# Patient Record
Sex: Male | Born: 1960 | Race: Black or African American | Hispanic: No | Marital: Married | Smoking: Current every day smoker
Health system: Southern US, Community
[De-identification: ages and names within clinical notes are randomized; demographics above are authoritative.]

## PROBLEM LIST (undated history)

## (undated) ENCOUNTER — Emergency Department

## (undated) DIAGNOSIS — E7800 Pure hypercholesterolemia, unspecified: Secondary | ICD-10-CM

## (undated) DIAGNOSIS — I10 Essential (primary) hypertension: Secondary | ICD-10-CM

## (undated) DIAGNOSIS — M109 Gout, unspecified: Secondary | ICD-10-CM

## (undated) HISTORY — PX: PROSTATECTOMY: SHX69

## (undated) HISTORY — PX: KNEE SURGERY: SHX244

---

## 2006-04-04 ENCOUNTER — Emergency Department (HOSPITAL_COMMUNITY): Admission: EM | Admit: 2006-04-04 | Discharge: 2006-04-04 | Payer: Self-pay | Admitting: Emergency Medicine

## 2006-10-17 ENCOUNTER — Emergency Department (HOSPITAL_COMMUNITY): Admission: EM | Admit: 2006-10-17 | Discharge: 2006-10-17 | Payer: Self-pay | Admitting: Emergency Medicine

## 2009-06-05 ENCOUNTER — Emergency Department (HOSPITAL_BASED_OUTPATIENT_CLINIC_OR_DEPARTMENT_OTHER): Admission: EM | Admit: 2009-06-05 | Discharge: 2009-06-05 | Payer: Self-pay | Admitting: Emergency Medicine

## 2009-06-05 ENCOUNTER — Ambulatory Visit: Payer: Self-pay | Admitting: Diagnostic Radiology

## 2010-11-06 ENCOUNTER — Emergency Department (INDEPENDENT_AMBULATORY_CARE_PROVIDER_SITE_OTHER)

## 2010-11-06 ENCOUNTER — Emergency Department (HOSPITAL_BASED_OUTPATIENT_CLINIC_OR_DEPARTMENT_OTHER)
Admission: EM | Admit: 2010-11-06 | Discharge: 2010-11-06 | Disposition: A | Discharge status: Home / Self Care | Attending: Emergency Medicine | Admitting: Emergency Medicine

## 2010-11-06 DIAGNOSIS — S2249XA Multiple fractures of ribs, unspecified side, initial encounter for closed fracture: Secondary | ICD-10-CM

## 2010-11-06 DIAGNOSIS — I10 Essential (primary) hypertension: Secondary | ICD-10-CM | POA: Insufficient documentation

## 2010-11-06 DIAGNOSIS — F17200 Nicotine dependence, unspecified, uncomplicated: Secondary | ICD-10-CM | POA: Insufficient documentation

## 2010-11-06 DIAGNOSIS — W010XXA Fall on same level from slipping, tripping and stumbling without subsequent striking against object, initial encounter: Secondary | ICD-10-CM

## 2010-11-06 DIAGNOSIS — E7800 Pure hypercholesterolemia, unspecified: Secondary | ICD-10-CM | POA: Insufficient documentation

## 2010-11-06 DIAGNOSIS — Y92009 Unspecified place in unspecified non-institutional (private) residence as the place of occurrence of the external cause: Secondary | ICD-10-CM | POA: Insufficient documentation

## 2010-11-14 LAB — CBC
HCT: 45.7 % (ref 39.0–52.0)
Hemoglobin: 15.7 g/dL (ref 13.0–17.0)
Platelets: 286 10*3/uL (ref 150–400)

## 2010-11-14 LAB — BASIC METABOLIC PANEL
Calcium: 9.3 mg/dL (ref 8.4–10.5)
Chloride: 103 mEq/L (ref 96–112)
GFR calc Af Amer: >60 mL/min (ref >60)
Glucose, Bld: 102 mg/dL — ABNORMAL HIGH (ref 70–99)

## 2010-11-14 LAB — DIFFERENTIAL
Basophils Relative: 2 % — ABNORMAL HIGH (ref 0–1)
Lymphs Abs: 2.3 10*3/uL (ref 0.7–4.0)
Monocytes Absolute: 0.9 10*3/uL (ref 0.1–1.0)
Monocytes Relative: 8 % (ref 3–12)
Neutro Abs: 7 10*3/uL (ref 1.7–7.7)

## 2010-11-14 LAB — URINALYSIS, ROUTINE W REFLEX MICROSCOPIC
Bilirubin Urine: NEGATIVE
Glucose, UA: NEGATIVE mg/dL
Ketones, ur: NEGATIVE mg/dL
Specific Gravity, Urine: 1.023 (ref 1.005–1.030)
Urobilinogen, UA: 0.2 mg/dL (ref 0.0–1.0)
pH: 6 (ref 5.0–8.0)

## 2010-11-14 LAB — URINE MICROSCOPIC-ADD ON

## 2015-03-15 ENCOUNTER — Emergency Department (HOSPITAL_BASED_OUTPATIENT_CLINIC_OR_DEPARTMENT_OTHER)

## 2015-03-15 ENCOUNTER — Emergency Department (HOSPITAL_BASED_OUTPATIENT_CLINIC_OR_DEPARTMENT_OTHER)
Admission: EM | Admit: 2015-03-15 | Discharge: 2015-03-15 | Disposition: A | Discharge status: Home / Self Care | Attending: Emergency Medicine | Admitting: Emergency Medicine

## 2015-03-15 ENCOUNTER — Encounter (HOSPITAL_BASED_OUTPATIENT_CLINIC_OR_DEPARTMENT_OTHER): Payer: Self-pay | Admitting: *Deleted

## 2015-03-15 DIAGNOSIS — Y9289 Other specified places as the place of occurrence of the external cause: Secondary | ICD-10-CM | POA: Insufficient documentation

## 2015-03-15 DIAGNOSIS — Y9389 Activity, other specified: Secondary | ICD-10-CM | POA: Insufficient documentation

## 2015-03-15 DIAGNOSIS — Z79899 Other long term (current) drug therapy: Secondary | ICD-10-CM | POA: Insufficient documentation

## 2015-03-15 DIAGNOSIS — W312XXA Contact with powered woodworking and forming machines, initial encounter: Secondary | ICD-10-CM | POA: Diagnosis not present

## 2015-03-15 DIAGNOSIS — Y998 Other external cause status: Secondary | ICD-10-CM | POA: Diagnosis not present

## 2015-03-15 DIAGNOSIS — S62521B Displaced fracture of distal phalanx of right thumb, initial encounter for open fracture: Secondary | ICD-10-CM | POA: Insufficient documentation

## 2015-03-15 DIAGNOSIS — E780 Pure hypercholesterolemia: Secondary | ICD-10-CM | POA: Insufficient documentation

## 2015-03-15 DIAGNOSIS — I10 Essential (primary) hypertension: Secondary | ICD-10-CM | POA: Insufficient documentation

## 2015-03-15 DIAGNOSIS — S61011A Laceration without foreign body of right thumb without damage to nail, initial encounter: Secondary | ICD-10-CM | POA: Diagnosis present

## 2015-03-15 HISTORY — DX: Essential (primary) hypertension: I10

## 2015-03-15 HISTORY — DX: Pure hypercholesterolemia, unspecified: E78.00

## 2015-03-15 MED ORDER — CEFTRIAXONE SODIUM 1 G IJ SOLR
1.0000 g | Freq: Once | INTRAMUSCULAR | Status: AC
  Administered 2015-03-15: 1 g via INTRAMUSCULAR
  Filled 2015-03-15: qty 10

## 2015-03-15 MED ORDER — TETANUS-DIPHTH-ACELL PERTUSSIS 5-2.5-18.5 LF-MCG/0.5 IM SUSP
0.5000 mL | Freq: Once | INTRAMUSCULAR | Status: AC
  Administered 2015-03-15: 0.5 mL via INTRAMUSCULAR
  Filled 2015-03-15: qty 0.5

## 2015-03-15 MED ORDER — OXYCODONE-ACETAMINOPHEN 5-325 MG PO TABS: 1.0000 | ORAL_TABLET | Freq: Four times a day (QID) | ORAL | Status: DC | PRN

## 2015-03-15 MED ORDER — CEPHALEXIN 500 MG PO CAPS: 500.0000 mg | ORAL_CAPSULE | Freq: Four times a day (QID) | ORAL | Status: DC

## 2015-03-15 MED ORDER — LIDOCAINE HCL (PF) 1 % IJ SOLN
INTRAMUSCULAR | Status: AC
  Administered 2015-03-15: 5 mL
  Filled 2015-03-15: qty 5

## 2015-03-15 MED ORDER — LIDOCAINE HCL (PF) 1 % IJ SOLN: 30.0000 mL | Freq: Once | INTRAMUSCULAR | Status: DC

## 2015-03-15 MED ORDER — OXYCODONE-ACETAMINOPHEN 5-325 MG PO TABS
2.0000 | ORAL_TABLET | Freq: Once | ORAL | Status: AC
  Administered 2015-03-15: 2 via ORAL
  Filled 2015-03-15: qty 2

## 2015-03-15 MED ORDER — LIDOCAINE HCL 2 % IJ SOLN
INTRAMUSCULAR | Status: AC
  Administered 2015-03-15: 400 mg
  Filled 2015-03-15: qty 20

## 2015-03-15 NOTE — Discharge Instructions (Signed)
Keflex as prescribed.  Percocet as prescribed as needed for pain.  Follow-up with Dr. Izora Ribas on Monday for a wound check. Call his office tomorrow to arrange this appointment. The contact information for his office has been provided in this discharge summary.   Thumb Fracture  There are many types of thumb fractures (breaks). There are different ways of treating these fractures, all of which may be correct, varying from case to case. Your caregiver will discuss different ways to treat these fractures with you. TREATMENT   Immobilization. This means the fracture is casted as it is without changing the positions of the fracture (bone pieces) involved. This fracture is casted in a "thumb spica" also called a hitchhiker cast. It is generally left on for 2 to 6 weeks.  Closed reduction. The bones are manipulated back into position without using surgery.  ORIF (open reduction and internal fixation). The fracture site is opened and the bone pieces are fixed into place with some type of hardware such as screws or wires. Your caregiver will discuss the type of fracture you have and the treatment that will be best for that problem. If surgery is the treatment of choice, the following is information for you to know and to let your caregiver know about prior to surgery. LET YOUR CAREGIVERS KNOW ABOUT:  Allergies.  Medications taken including herbs, eye drops, over the counter medications, and creams.  Use of steroids (by mouth or creams).  Previous problems with anesthetics or Novocain.  Family history of anesthetic complications..  Possibility of pregnancy, if this applies.  History of blood clots (thrombophlebitis).  History of bleeding or blood problems.  Previous surgery.  Other health problems. AFTER THE PROCEDURE  After surgery, you will be taken to the recovery area. A nurse will watch and check your progress. Once you are awake, stable, and taking fluids well, barring other problems  you will be allowed to go home. Once home, an ice pack applied to your operative site may help with discomfort and keep the swelling down. Elevate your hand above your heart as much as possible for the first 4-5 days after the injury/surgery. HOME CARE INSTRUCTIONS   Follow your caregiver's instructions as to activities, exercises, physical therapy, and driving a car.  Use thumb and exercise as directed.  Only take over-the-counter or prescription medicines for pain, discomfort, or fever as directed by your caregiver. Do not take aspirin until your caregiver instructs. This can increase bleeding immediately following surgery. SEEK MEDICAL CARE IF:   There is increased bleeding (more than a small spot) from the wound or from beneath your cast or splint.  There is redness, swelling, or increasing pain in the wound or from beneath your cast or splint.  You have pus coming from wound or from beneath your cast or splint.  An unexplained oral temperature above 102 F (38.9 C) develops.  There is a foul smell coming from the wound or dressing or from beneath your cast or splint. SEEK IMMEDIATE MEDICAL CARE IF:   You develop severe pain, decreased sensation such as numbness or tingling.  You develop a rash.  You have difficulty breathing.  Youhave any allergic problems. If you do not have a window in your cast for observing the wound, a discharge or minor bleeding may show up as a stain on the outside of your cast. Report these findings to your caregiver. If you have a removable splint overlying the surgical dressings it is common to see a small  amount of bleeding. Change the dressings as instructed by your caregiver. Document Released: 04/26/2003 Document Revised: 10/20/2011 Document Reviewed: 09/30/2013 [facility] Patient Information 2015 [place], [place]. This information is not intended to replace advice given to you by your health care provider. Make sure you discuss any questions you have  with your health care provider.

## 2015-03-15 NOTE — ED Provider Notes (Signed)
CSN: [id number]     Arrival date & time 03/15/15  2039 History  This chart was scribed for Geoffery Lyons, MD by Octavia Heir, ED Scribe. This patient was seen in room MH07/MH07 and the patient's care was started at 9:26 PM.    Chief Complaint  Patient presents with  . Extremity Laceration      Patient is a 54 y.o. male presenting with skin laceration. The history is provided by the patient. No language interpreter was used.  Laceration Location:  Hand Hand laceration location:  L finger Quality: jagged   Bleeding: controlled   Laceration mechanism:  Unable to specify Pain details:    Quality:  Radiating and throbbing   Severity:  Moderate   Timing:  Constant   Progression:  Unchanged Foreign body present:  No foreign bodies Relieved by:  Pressure Worsened by:  Nothing tried Ineffective treatments:  None tried Tetanus status:  Unknown  HPI Comments: Avenir Lozinski is a 54 y.o. male who presents to the Emergency Department presenting with a right thumb laceration onset this evening. Pt was cutting wood on a table saw and sliced the tip of his right thumb. He states he is an overall healthy person and he is unknown of his last tetanus shot.   Past Medical History  Diagnosis Date  . Hypertension   . Hypercholesteremia    History reviewed. No pertinent past surgical history. History reviewed. No pertinent family history. History  Substance Use Topics  . Smoking status: Never Smoker   . Smokeless tobacco: Not on file  . Alcohol Use: 2.4 oz/week    4 Shots of liquor per week    Review of Systems  Skin: Positive for wound.  All other systems reviewed and are negative.     Allergies  Review of patient's allergies indicates no known allergies.  Home Medications   Prior to Admission medications   Medication Sig Start Date End Date Taking? Authorizing Provider  atorvastatin (LIPITOR) 40 MG tablet Take 40 mg by mouth daily.   Yes Historical Provider, MD  febuxostat  (ULORIC) 40 MG tablet Take 40 mg by mouth daily.   Yes Historical Provider, MD  hydrochlorothiazide (HYDRODIURIL) 25 MG tablet Take 25 mg by mouth daily.   Yes Historical Provider, MD  potassium chloride (K-DUR,KLOR-CON) 10 MEQ tablet Take 10 mEq by mouth 2 (two) times daily.   Yes Historical Provider, MD  UNKNOWN TO PATIENT    Yes Historical Provider, MD   Triage vitals: BP 158/91 mmHg  Pulse 92  Temp(Src) 98.1 F (36.7 C) (Oral)  Resp 18  Ht 5' 11.5" (1.816 m)  Wt 220 lb (99.791 kg)  BMI 30.26 kg/m2  SpO2 98% Physical Exam  Constitutional: He is oriented to person, place, and time. He appears well-developed and well-nourished.  HENT:  Head: Normocephalic.  Eyes: EOM are normal.  Neck: Normal range of motion.  Pulmonary/Chest: Effort normal.  Abdominal: He exhibits no distension.  Musculoskeletal: Normal range of motion.  The right thumb has a jagged laceration through the tip of the finger.  Neurological: He is alert and oriented to person, place, and time.  Psychiatric: He has a normal mood and affect.  Nursing note and vitals reviewed.   ED Course  Procedures  DIAGNOSTIC STUDIES: Oxygen Saturation is 98% on RA, normal by my interpretation.  COORDINATION OF CARE:  9:29 PM Discussed treatment plan which includes talk with hand specialist and digital block with pt at bedside and pt agreed to plan.  Labs Review Labs Reviewed - No data to display  Imaging Review Dg Finger Thumb Right  03/15/2015   CLINICAL DATA:  Table saw injury  EXAM: RIGHT THUMB 2+V  COMPARISON:  None.  FINDINGS: There is a bony injury to the distal tuft with multiple tiny fragments displaced away from the palmar aspect but there does not appear to be a fracture across the full cross-section of the bone. There is no dislocation. There is disruption of the overlying soft tissues. There is no metallic foreign body.  IMPRESSION: Bony injury with multiple small fragments displaced away from the palmar aspect of  the distal tuft. No foreign body evident.   Electronically Signed   By: Ellery Plunk M.D.   On: 03/15/2015 21:22     EKG Interpretation None     LACERATION REPAIR Performed by: Nance Pear PA Student Authorized by: Geoffery Lyons Consent: Verbal consent obtained. Risks and benefits: risks, benefits and alternatives were discussed Consent given by: patient Patient identity confirmed: provided demographic data Prepped and Draped in normal sterile fashion Wound explored  Laceration Location: Right thumb  Laceration Length: 1.5 cm  No Foreign Bodies seen or palpated  Anesthesia: Digital block   Local anesthetic: lidocaine 2 % without epinephrine  Anesthetic total: 6 ml  Irrigation method: syringe Amount of cleaning: standard  Skin closure: 5-0 Prolene   Number of sutures: 4   Technique: Simple interrupted   Patient tolerance: Patient tolerated the procedure well with no immediate complications.   MDM   Final diagnoses:  None    Patient presents with a laceration to his right thumb that occurred while using a table saw. The x-rays reveal small fragments of the tuft consistent with an open fracture. This was treated with copious irrigation, antibiotics, wound closure, and follow-up with hand surgery. I've discussed this case with Dr. Izora Ribas who is in agreement with my treatment plan. He will follow-up in the office next week for a wound check.  The sutures were placed by Physician Assistant Student, Daphane Shepherd under my direct supervision.  I personally performed the services described in this documentation, which was scribed in my presence. The recorded information has been reviewed and is accurate.     Geoffery Lyons, MD 03/15/15 2256

## 2015-03-15 NOTE — ED Notes (Signed)
Pt cut his right thumb on his table saw tonight.  Bleeding controlled in triage.

## 2015-03-15 NOTE — ED Notes (Signed)
Lac to rt thumb w table saw,, power had bee out off at time of lac

## 2017-12-24 ENCOUNTER — Other Ambulatory Visit: Payer: Self-pay

## 2017-12-24 ENCOUNTER — Emergency Department (HOSPITAL_BASED_OUTPATIENT_CLINIC_OR_DEPARTMENT_OTHER)
Admission: EM | Admit: 2017-12-24 | Discharge: 2017-12-24 | Disposition: A | Discharge status: Home / Self Care | Attending: Emergency Medicine | Admitting: Emergency Medicine

## 2017-12-24 ENCOUNTER — Encounter (HOSPITAL_BASED_OUTPATIENT_CLINIC_OR_DEPARTMENT_OTHER): Payer: Self-pay | Admitting: Emergency Medicine

## 2017-12-24 DIAGNOSIS — I10 Essential (primary) hypertension: Secondary | ICD-10-CM | POA: Insufficient documentation

## 2017-12-24 DIAGNOSIS — M109 Gout, unspecified: Secondary | ICD-10-CM | POA: Insufficient documentation

## 2017-12-24 DIAGNOSIS — M79674 Pain in right toe(s): Secondary | ICD-10-CM | POA: Diagnosis present

## 2017-12-24 DIAGNOSIS — Z79899 Other long term (current) drug therapy: Secondary | ICD-10-CM | POA: Diagnosis not present

## 2017-12-24 DIAGNOSIS — F17290 Nicotine dependence, other tobacco product, uncomplicated: Secondary | ICD-10-CM | POA: Diagnosis not present

## 2017-12-24 HISTORY — DX: Gout, unspecified: M10.9

## 2017-12-24 MED ORDER — OXYCODONE-ACETAMINOPHEN 5-325 MG PO TABS: 1.0000 | ORAL_TABLET | ORAL | 0 refills | Status: DC | PRN

## 2017-12-24 MED ORDER — INDOMETHACIN 50 MG PO CAPS: 50.0000 mg | ORAL_CAPSULE | Freq: Three times a day (TID) | ORAL | 0 refills | Status: DC | PRN

## 2017-12-24 NOTE — ED Triage Notes (Signed)
Pt c/o gout flare to R big toe x 1 week. States the [place] has ordered his indomethacin but they were out of it and it has to be mailed to him so he will not receive it for several more days.

## 2017-12-24 NOTE — ED Provider Notes (Signed)
[facility] EMERGENCY DEPARTMENT Provider Note   CSN: [id number] Arrival date & time: 12/24/17  4098     History   Chief Complaint Chief Complaint  Patient presents with  . Gout    HPI Albert King is a 57 y.o. male.  The history is provided by the patient and medical records.  Toe Pain  This is a recurrent problem. The current episode started 2 days ago. The problem occurs constantly. The problem has not changed since onset.Pertinent negatives include no chest pain, no abdominal pain, no headaches and no shortness of breath. The symptoms are aggravated by standing and walking. Nothing relieves the symptoms. He has tried nothing for the symptoms. The treatment provided no relief.    Past Medical History:  Diagnosis Date  . Gout   . Hypercholesteremia   . Hypertension     There are no active problems to display for this patient.   Past Surgical History:  Procedure Laterality Date  . KNEE SURGERY          Home Medications    Prior to Admission medications   Medication Sig Start Date End Date Taking? Authorizing Provider  losartan (COZAAR) 100 MG tablet Take 100 mg by mouth daily.   Yes [provider]  meloxicam (MOBIC) 15 MG tablet Take 15 mg by mouth daily.   Yes [provider]  tamsulosin (FLOMAX) 0.4 MG CAPS capsule Take 0.4 mg by mouth.   Yes [provider]  atorvastatin (LIPITOR) 40 MG tablet Take 40 mg by mouth daily.    [provider]  cephALEXin (KEFLEX) 500 MG capsule Take 1 capsule (500 mg total) by mouth 4 (four) times daily. 03/15/15   Geoffery Lyons, MD  febuxostat (ULORIC) 40 MG tablet Take 40 mg by mouth daily.    [provider]  hydrochlorothiazide (HYDRODIURIL) 25 MG tablet Take 25 mg by mouth daily.    [provider]  oxyCODONE-acetaminophen (PERCOCET) 5-325 MG per tablet Take 1-2 tablets by mouth every 6 (six) hours as needed. 03/15/15   Geoffery Lyons, MD  potassium chloride  (K-DUR,KLOR-CON) 10 MEQ tablet Take 10 mEq by mouth 2 (two) times daily.    [provider]  UNKNOWN TO PATIENT     [provider]    Family History No family history on file.  Social History Social History   Tobacco Use  . Smoking status: Current Every Day Smoker    Types: E-cigarettes  . Smokeless tobacco: Never Used  Substance Use Topics  . Alcohol use: Yes    Alcohol/week: 2.4 oz    Types: 4 Shots of liquor per week  . Drug use: No     Allergies   Allopurinol   Review of Systems Review of Systems  Constitutional: Negative for chills, fatigue and fever.  HENT: Negative for congestion.   Eyes: Negative for visual disturbance.  Respiratory: Negative for cough, chest tightness, shortness of breath and wheezing.   Cardiovascular: Negative for chest pain and palpitations.  Gastrointestinal: Negative for abdominal pain.  Genitourinary: Negative for flank pain.  Musculoskeletal: Negative for back pain, neck pain and neck stiffness.  Skin: Positive for color change (redness near toe). Negative for wound.  Neurological: Negative for light-headedness and headaches.  Psychiatric/Behavioral: Negative for agitation.  All other systems reviewed and are negative.    Physical Exam Updated Vital Signs BP (!) 151/79 (BP Location: Right Arm)   Pulse 82   Temp 98.3 F (36.8 C) (Oral)   Resp  18   SpO2 98%   Physical Exam  Constitutional: He is oriented to person, place, and time. He appears well-developed and well-nourished. No distress.  HENT:  Head: Normocephalic and atraumatic.  Mouth/Throat: Oropharynx is clear and moist.  Eyes: Conjunctivae are normal.  Neck: Neck supple.  Cardiovascular: Normal rate.  Pulmonary/Chest: Effort normal. No respiratory distress.  Abdominal: Soft. There is no tenderness.  Musculoskeletal: He exhibits tenderness. He exhibits no edema.       Right foot: There is tenderness. There is no swelling, normal capillary refill,  no crepitus, no deformity and no laceration.       Feet:  Neurological: He is alert and oriented to person, place, and time. No sensory deficit. He exhibits normal muscle tone.  Skin: Skin is warm and dry. Capillary refill takes less than 2 seconds. He is not diaphoretic. There is erythema. No pallor.  Psychiatric: He has a normal mood and affect.  Nursing note and vitals reviewed.    ED Treatments / Results  Labs (all labs ordered are listed, but only abnormal results are displayed) Labs Reviewed - No data to display  EKG None  Radiology No results found.  Procedures Procedures (including critical care time)  Medications Ordered in ED Medications - No data to display   Initial Impression / Assessment and Plan / ED Course  I have reviewed the triage vital signs and the nursing notes.  Pertinent labs & imaging results that were available during my care of the patient were reviewed by me and considered in my medical decision making (see chart for details).     Albert King is a 57 y.o. male with past medical history significant for hypertension, hypercholesterolemia, and gout who presents with gout flare of her right great toe.  Patient reports that he started having pain several days ago in his right great toe and saw [facility] several days ago.  He says that he had blood work and imaging that was reassuring and consistent with a gout flare.  He reports that he was given a prescription for indomethacin however his by mail pharmacy has been unable to send in the medications yet.  He says that his pain is continued and is moderate.  He reports that he is unable to stand while at work due to the pain.  He says that he has had no success with over-the-counter medications.  He denies fevers, chills, or any other symptoms.  He reports is consistent with prior gout flares.  On exam, patient has warmth redness and tenderness of the right base of the great toe.  Normal ankle  movement.  Normal sensation and pulse.  Normal toe strength.  Exam otherwise unremarkable.  No other redness seen and no evidence of laceration.  Given consistent with prior gout flare, suspect gout.  Patient's primary physician team thought that this was his gout.  Patient just has not been able to get the medication as needed for management.  Patient will be given prescription for indomethacin as well as several doses of Percocet to help with the acute pain.  Patient will follow-up with his [facility] and PCP for further management.  Do not feel patient has cellulitis or other deep infection of the foot.  Patient otherwise appears well.  Patient understood plan of care as well as return precautions.  Patient discharged in good condition.    Final Clinical Impressions(s) / ED Diagnoses   Final diagnoses:  Gouty arthritis of right great toe  ED Discharge Orders        Ordered    indomethacin (INDOCIN) 50 MG capsule  3 times daily PRN     12/24/17 0750    oxyCODONE-acetaminophen (PERCOCET/ROXICET) 5-325 MG tablet  Every 4 hours PRN     12/24/17 0750      Clinical Impression: 1. Gouty arthritis of right great toe     Disposition: Discharge  Condition: Good  I have discussed the results, Dx and Tx plan with the pt(& family if present). He/she/they expressed understanding and agree(s) with the plan. Discharge instructions discussed at great length. Strict return precautions discussed and pt &/or family have verbalized understanding of the instructions. No further questions at time of discharge.    New Prescriptions   INDOMETHACIN (INDOCIN) 50 MG CAPSULE    Take 1 capsule (50 mg total) by mouth 3 (three) times daily as needed for moderate pain.   OXYCODONE-ACETAMINOPHEN (PERCOCET/ROXICET) 5-325 MG TABLET    Take 1 tablet by mouth every 4 (four) hours as needed for severe pain.    Follow Up: [facility] [address] [id number] mc [address] [place] [address] [phone]       Tegeler, Canary Brim, MD 12/24/17 [phone]

## 2017-12-24 NOTE — ED Notes (Signed)
ED Provider at bedside. 

## 2017-12-24 NOTE — Discharge Instructions (Signed)
Please follow-up with your primary care physician and your [facility] team for further management of your gout.  Please use the medicine they have sent you for further management.  Please use the medicine we are writing today to help with your acute pain.  If any symptoms change or worsen, please return to the nearest emergency department.

## 2020-07-21 ENCOUNTER — Emergency Department (HOSPITAL_BASED_OUTPATIENT_CLINIC_OR_DEPARTMENT_OTHER)

## 2020-07-21 ENCOUNTER — Encounter (HOSPITAL_BASED_OUTPATIENT_CLINIC_OR_DEPARTMENT_OTHER): Payer: Self-pay | Admitting: Emergency Medicine

## 2020-07-21 ENCOUNTER — Emergency Department (HOSPITAL_BASED_OUTPATIENT_CLINIC_OR_DEPARTMENT_OTHER)
Admission: EM | Admit: 2020-07-21 | Discharge: 2020-07-21 | Disposition: A | Discharge status: Home / Self Care | Attending: Emergency Medicine | Admitting: Emergency Medicine

## 2020-07-21 ENCOUNTER — Other Ambulatory Visit: Payer: Self-pay

## 2020-07-21 DIAGNOSIS — F17290 Nicotine dependence, other tobacco product, uncomplicated: Secondary | ICD-10-CM | POA: Diagnosis not present

## 2020-07-21 DIAGNOSIS — M25562 Pain in left knee: Secondary | ICD-10-CM | POA: Diagnosis not present

## 2020-07-21 DIAGNOSIS — I10 Essential (primary) hypertension: Secondary | ICD-10-CM | POA: Diagnosis not present

## 2020-07-21 DIAGNOSIS — Z79899 Other long term (current) drug therapy: Secondary | ICD-10-CM | POA: Insufficient documentation

## 2020-07-21 MED ORDER — PREDNISONE 50 MG PO TABS: 50.0000 mg | ORAL_TABLET | Freq: Every day | ORAL | 0 refills | Status: DC

## 2020-07-21 NOTE — ED Triage Notes (Signed)
Pt arrives pov, ambulatory with c/o left knee pain x 2 days. Pt endorses hx of same, reports surgery 2 yrs pta. Pt states taking meloxicam this am

## 2020-07-21 NOTE — ED Notes (Signed)
Pt discharged to home. Discharge instructions have been discussed with patient and/or family members. Pt verbally acknowledges understanding d/c instructions, and endorses comprehension to checkout at registration before leaving.  °

## 2020-07-21 NOTE — ED Provider Notes (Signed)
[facility] EMERGENCY DEPARTMENT Provider Note   CSN: [id number] Arrival date & time: 07/21/20  3532     History Chief Complaint  Patient presents with  . Knee Pain    Albert King is a 59 y.o. male.  HPI   Patient presents to the ED with complaints of left-sided knee pain.  Patient states he started noticing pain a couple of days ago.  Last evening and this morning the pain was more severe.  He did try taking his meloxicam but the pain persisted.  Patient does have history of prior issues with his left knee.  He had surgery a couple years ago and also has had steroid injections.  He denies any fevers.  No recent falls.  Past Medical History:  Diagnosis Date  . Gout   . Hypercholesteremia   . Hypertension     There are no problems to display for this patient.   Past Surgical History:  Procedure Laterality Date  . KNEE SURGERY         History reviewed. No pertinent family history.  Social History   Tobacco Use  . Smoking status: Current Every Day Smoker    Types: E-cigarettes  . Smokeless tobacco: Never Used  Vaping Use  . Vaping Use: Every day  Substance Use Topics  . Alcohol use: Yes    Alcohol/week: 4.0 standard drinks    Types: 4 Shots of liquor per week  . Drug use: No    Home Medications Prior to Admission medications   Medication Sig Start Date End Date Taking? Authorizing Provider  amLODipine (NORVASC) 5 MG tablet Take by mouth. 01/19/19  Yes [provider]  colchicine 0.6 MG tablet Take 2 tablets by mouth once and take 1 tablet by mouth 1 hour later as needed for acute gout flare only 04/22/19  Yes [provider]  DULoxetine (CYMBALTA) 20 MG capsule Take by mouth. 01/11/19  Yes [provider]  sildenafil (VIAGRA) 100 MG tablet Take by mouth. 01/11/19  Yes [provider]  atorvastatin (LIPITOR) 40 MG tablet Take 40 mg by mouth daily.    [provider]  cephALEXin (KEFLEX) 500 MG capsule Take  1 capsule (500 mg total) by mouth 4 (four) times daily. 03/15/15   Geoffery Lyons, MD  diclofenac Sodium (VOLTAREN) 1 % GEL Place onto the skin.    [provider]  febuxostat (ULORIC) 40 MG tablet Take 40 mg by mouth daily.    [provider]  hydrochlorothiazide (HYDRODIURIL) 25 MG tablet Take 25 mg by mouth daily.    [provider]  indomethacin (INDOCIN) 50 MG capsule Take 1 capsule (50 mg total) by mouth 3 (three) times daily as needed for moderate pain. 12/24/17   Tegeler, Canary Brim, MD  losartan (COZAAR) 100 MG tablet Take 100 mg by mouth daily.    [provider]  meloxicam (MOBIC) 15 MG tablet Take 15 mg by mouth daily.    [provider]  oxyCODONE-acetaminophen (PERCOCET) 5-325 MG per tablet Take 1-2 tablets by mouth every 6 (six) hours as needed. 03/15/15   Geoffery Lyons, MD  oxyCODONE-acetaminophen (PERCOCET/ROXICET) 5-325 MG tablet Take 1 tablet by mouth every 4 (four) hours as needed for severe pain. 12/24/17   Tegeler, Canary Brim, MD  potassium chloride (K-DUR,KLOR-CON) 10 MEQ tablet Take 10 mEq by mouth 2 (two) times daily.    [provider]  predniSONE (DELTASONE) 50 MG tablet Take 1 tablet (50 mg total) by mouth daily. 07/21/20  Linwood Dibbles, MD  tamsulosin (FLOMAX) 0.4 MG CAPS capsule Take 0.4 mg by mouth.    [provider]  UNKNOWN TO PATIENT     [provider]    Allergies    Allopurinol  Review of Systems   Review of Systems  All other systems reviewed and are negative.   Physical Exam Updated Vital Signs BP (!) 159/90 (BP Location: Right Arm)   Pulse 89   Temp 98.2 F (36.8 C) (Oral)   Resp 18   Ht 1.803 m (5\' 11" )   Wt 102.1 kg   SpO2 99%   BMI 31.38 kg/m   Physical Exam Vitals and nursing note reviewed.  Constitutional:      General: He is not in acute distress.    Appearance: He is well-developed.  HENT:     Head: Normocephalic and atraumatic.     Right Ear: External ear  normal.     Left Ear: External ear normal.  Eyes:     General: No scleral icterus.       Right eye: No discharge.        Left eye: No discharge.     Conjunctiva/sclera: Conjunctivae normal.  Neck:     Trachea: No tracheal deviation.  Cardiovascular:     Rate and Rhythm: Normal rate.  Pulmonary:     Effort: Pulmonary effort is normal. No respiratory distress.     Breath sounds: No stridor.  Abdominal:     General: There is no distension.  Musculoskeletal:        General: Tenderness present. No swelling or deformity.     Cervical back: Neck supple.     Comments: Tenderness palpation left knee, no erythema, no notable effusion, mild pain with range of motion  Skin:    General: Skin is warm and dry.     Findings: No rash.  Neurological:     Mental Status: He is alert.     Cranial Nerves: Cranial nerve deficit: no gross deficits.     ED Results / Procedures / Treatments   Labs (all labs ordered are listed, but only abnormal results are displayed) Labs Reviewed - No data to display  EKG None  Radiology DG Knee Complete 4 Views Left  Result Date: 07/21/2020 CLINICAL DATA:  Acute left knee pain and swelling without known injury. EXAM: LEFT KNEE - COMPLETE 4+ VIEW COMPARISON:  None. FINDINGS: No evidence of fracture, dislocation, or joint effusion. No evidence of arthropathy or other focal bone abnormality. Soft tissues are unremarkable. IMPRESSION: Negative. Electronically Signed   By: 14/06/2020 M.D.   On: 07/21/2020 10:06    Procedures Procedures (including critical care time)  Medications Ordered in ED Medications - No data to display  ED Course  I have reviewed the triage vital signs and the nursing notes.  Pertinent labs & imaging results that were available during my care of the patient were reviewed by me and considered in my medical decision making (see chart for details).    MDM Rules/Calculators/A&P                          Patient presented to the ED  for evaluation of knee pain.  Patient has history of prior arthroscopic knee surgery.  X-rays do not show any acute abnormalities.  No findings to suggest acute infection on exam.  No significant effusion noted.  Patient states he has been given steroids in the past.  I do see a history of gout.  We will try a short course of oral steroids.  Outpatient follow-up with orthopedics.  He can continue his current pain medication. Final Clinical Impression(s) / ED Diagnoses Final diagnoses:  Acute pain of left knee    Rx / DC Orders ED Discharge Orders         Ordered    predniSONE (DELTASONE) 50 MG tablet  Daily        07/21/20 1028           Linwood Dibbles, MD 07/21/20 1029

## 2020-07-21 NOTE — Discharge Instructions (Signed)
Take the steroids as prescribed.  Follow-up with your orthopedic doctor for further evaluation if the knee pain persists.  Continue your meloxicam for pain.

## 2021-05-01 IMAGING — CR DG KNEE COMPLETE 4+V*L*
4 series · 4 of 4 positions shown · non-contrast
Comparison: None.

CLINICAL DATA: Acute left knee pain and swelling without known
injury.

EXAM:
LEFT KNEE - COMPLETE 4+ VIEW

[t knee ap left]
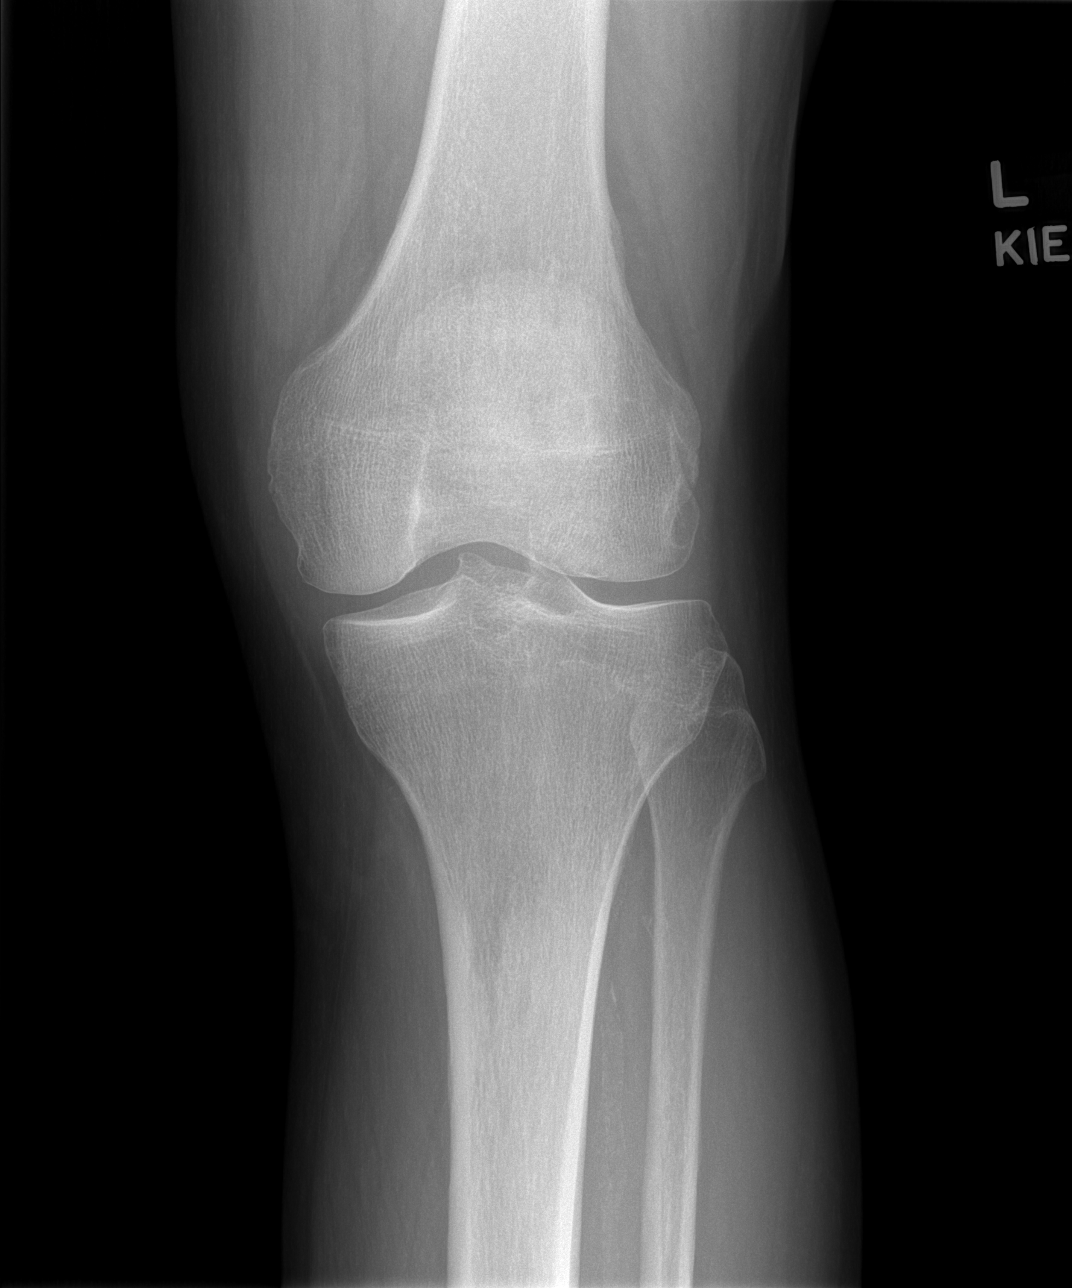

[t knee oblique left (1 of 2)]
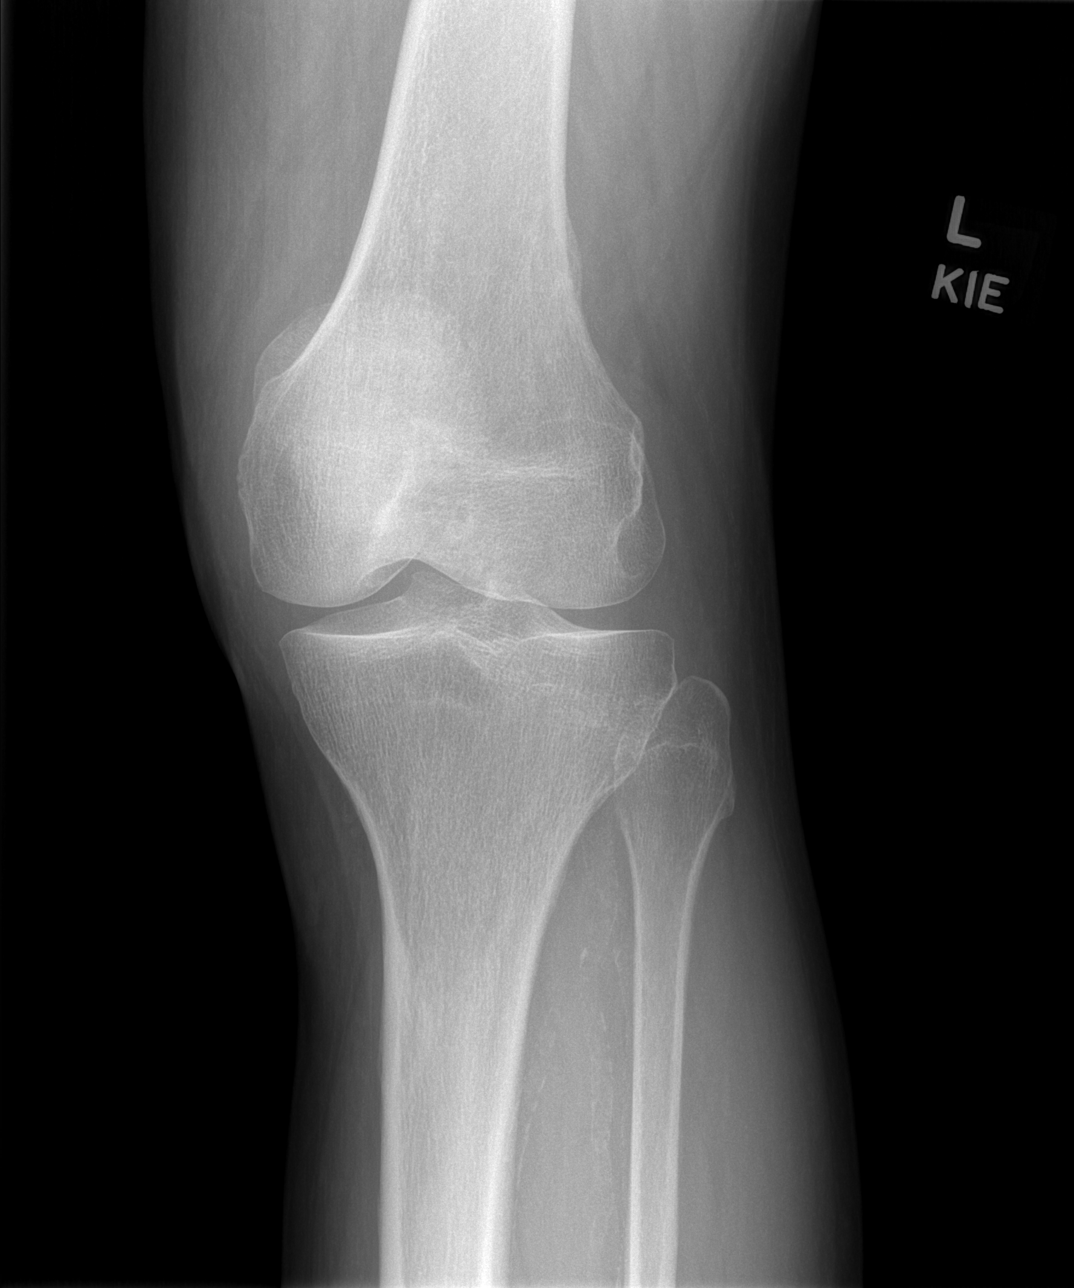

[t knee oblique left (2 of 2)]
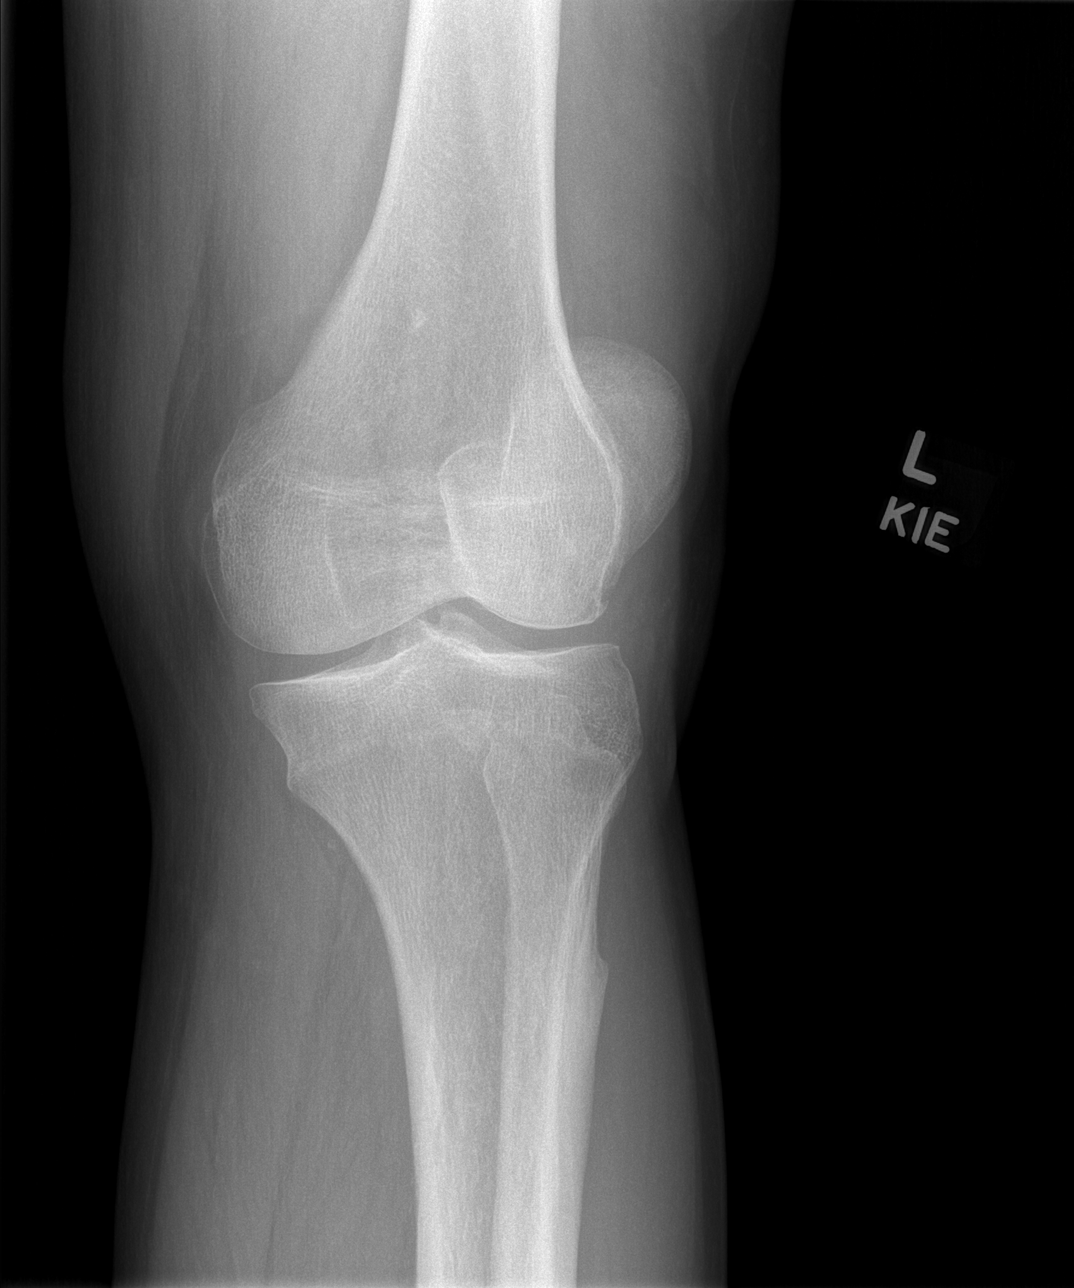

[t knee lat left]
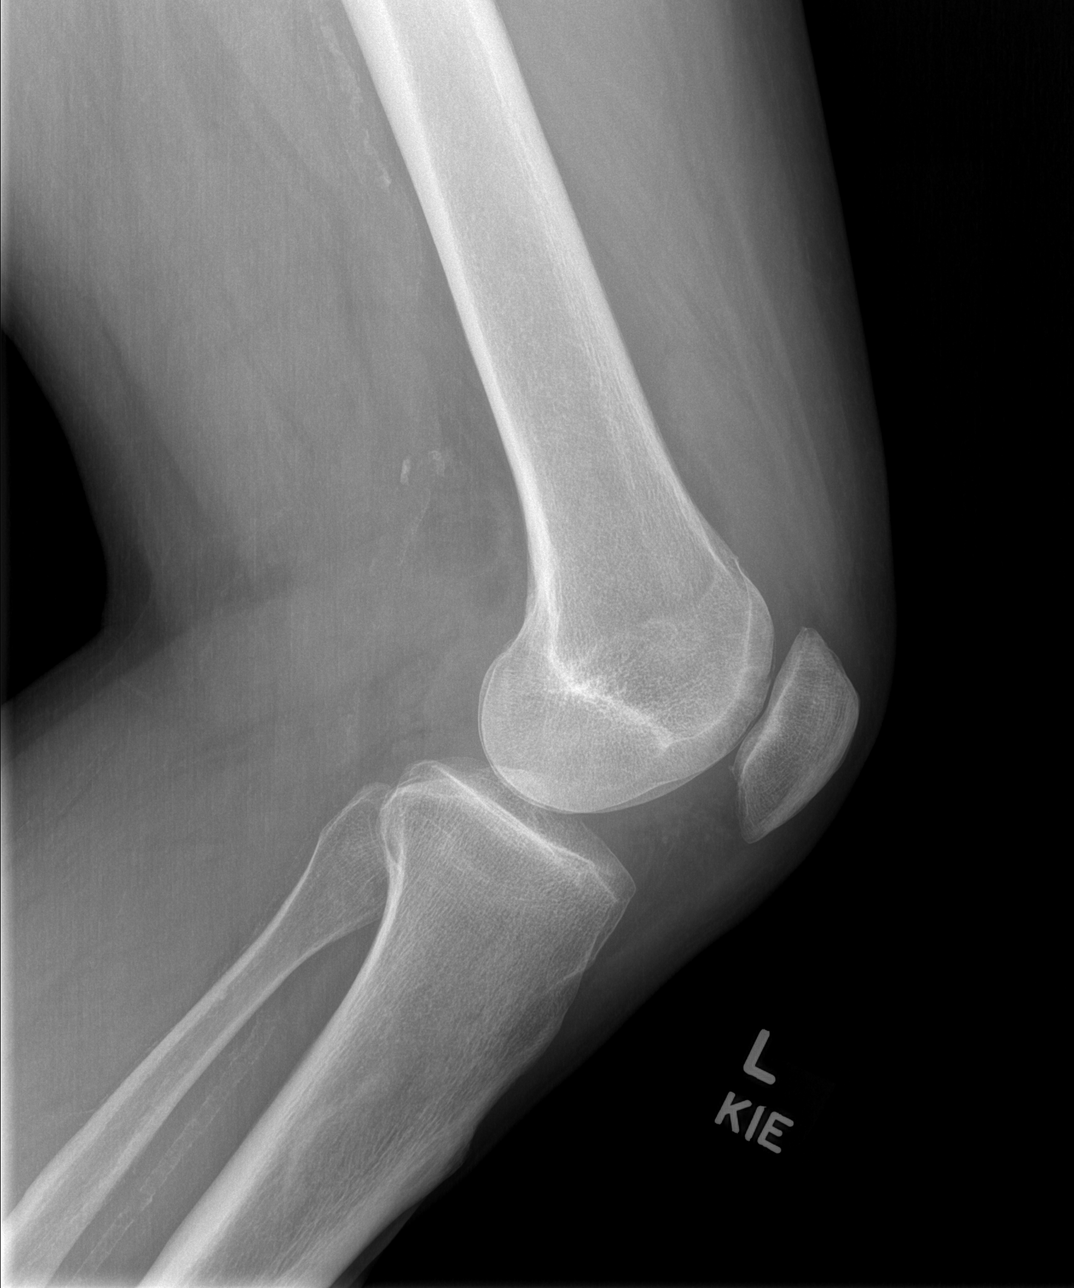

[4 of 4 positions shown; findings below may reference images not displayed]

FINDINGS: No evidence of fracture, dislocation, or joint effusion. No evidence
of arthropathy or other focal bone abnormality. Soft tissues are
unremarkable.
IMPRESSION: Negative.

## 2022-09-12 ENCOUNTER — Other Ambulatory Visit: Payer: Self-pay

## 2022-09-12 ENCOUNTER — Encounter (HOSPITAL_BASED_OUTPATIENT_CLINIC_OR_DEPARTMENT_OTHER): Payer: Self-pay | Admitting: Emergency Medicine

## 2022-09-12 DIAGNOSIS — I43 Cardiomyopathy in diseases classified elsewhere: Secondary | ICD-10-CM | POA: Diagnosis present

## 2022-09-12 DIAGNOSIS — D735 Infarction of spleen: Secondary | ICD-10-CM | POA: Diagnosis not present

## 2022-09-12 DIAGNOSIS — E7800 Pure hypercholesterolemia, unspecified: Secondary | ICD-10-CM | POA: Diagnosis present

## 2022-09-12 DIAGNOSIS — I700 Atherosclerosis of aorta: Secondary | ICD-10-CM | POA: Diagnosis present

## 2022-09-12 DIAGNOSIS — F17290 Nicotine dependence, other tobacco product, uncomplicated: Secondary | ICD-10-CM | POA: Diagnosis present

## 2022-09-12 DIAGNOSIS — R739 Hyperglycemia, unspecified: Secondary | ICD-10-CM | POA: Diagnosis not present

## 2022-09-12 DIAGNOSIS — Z8546 Personal history of malignant neoplasm of prostate: Secondary | ICD-10-CM

## 2022-09-12 DIAGNOSIS — F1010 Alcohol abuse, uncomplicated: Secondary | ICD-10-CM | POA: Diagnosis present

## 2022-09-12 DIAGNOSIS — Z888 Allergy status to other drugs, medicaments and biological substances status: Secondary | ICD-10-CM

## 2022-09-12 DIAGNOSIS — I5021 Acute systolic (congestive) heart failure: Secondary | ICD-10-CM | POA: Diagnosis not present

## 2022-09-12 DIAGNOSIS — I110 Hypertensive heart disease with heart failure: Secondary | ICD-10-CM | POA: Diagnosis present

## 2022-09-12 DIAGNOSIS — R7303 Prediabetes: Secondary | ICD-10-CM | POA: Diagnosis present

## 2022-09-12 DIAGNOSIS — M898X8 Other specified disorders of bone, other site: Secondary | ICD-10-CM | POA: Diagnosis present

## 2022-09-12 DIAGNOSIS — D75839 Thrombocytosis, unspecified: Secondary | ICD-10-CM | POA: Diagnosis present

## 2022-09-12 DIAGNOSIS — D72829 Elevated white blood cell count, unspecified: Secondary | ICD-10-CM | POA: Diagnosis present

## 2022-09-12 DIAGNOSIS — Z79899 Other long term (current) drug therapy: Secondary | ICD-10-CM

## 2022-09-12 DIAGNOSIS — M109 Gout, unspecified: Secondary | ICD-10-CM | POA: Diagnosis present

## 2022-09-12 DIAGNOSIS — I7411 Embolism and thrombosis of thoracic aorta: Secondary | ICD-10-CM | POA: Diagnosis present

## 2022-09-12 DIAGNOSIS — Z7952 Long term (current) use of systemic steroids: Secondary | ICD-10-CM

## 2022-09-12 DIAGNOSIS — K760 Fatty (change of) liver, not elsewhere classified: Secondary | ICD-10-CM | POA: Diagnosis present

## 2022-09-12 DIAGNOSIS — Z791 Long term (current) use of non-steroidal anti-inflammatories (NSAID): Secondary | ICD-10-CM

## 2022-09-12 DIAGNOSIS — R710 Precipitous drop in hematocrit: Secondary | ICD-10-CM | POA: Diagnosis not present

## 2022-09-12 DIAGNOSIS — E876 Hypokalemia: Secondary | ICD-10-CM | POA: Diagnosis present

## 2022-09-12 LAB — URINALYSIS, ROUTINE W REFLEX MICROSCOPIC
Bilirubin Urine: NEGATIVE
Glucose, UA: NEGATIVE mg/dL
Hgb urine dipstick: NEGATIVE
Ketones, ur: NEGATIVE mg/dL
Leukocytes,Ua: NEGATIVE
Nitrite: NEGATIVE
Specific Gravity, Urine: 1.02 (ref 1.005–1.030)
pH: 7 (ref 5.0–8.0)

## 2022-09-12 LAB — CBC
HCT: 40.3 % (ref 39.0–52.0)
Hemoglobin: 13.4 g/dL (ref 13.0–17.0)
MCH: 29.3 pg (ref 26.0–34.0)
MCHC: 33.3 g/dL (ref 30.0–36.0)
MCV: 88 fL (ref 80.0–100.0)
Platelets: 567 10*3/uL — ABNORMAL HIGH (ref 150–400)
RBC: 4.58 MIL/uL (ref 4.22–5.81)
RDW: 16.5 % — ABNORMAL HIGH (ref 11.5–15.5)
WBC: 12.7 10*3/uL — ABNORMAL HIGH (ref 4.0–10.5)
nRBC: 0 % (ref 0.0–0.2)

## 2022-09-12 LAB — COMPREHENSIVE METABOLIC PANEL
ALT: 20 U/L (ref 0–44)
AST: 28 U/L (ref 15–41)
Albumin: 3.5 g/dL (ref 3.5–5.0)
Alkaline Phosphatase: 138 U/L — ABNORMAL HIGH (ref 38–126)
Anion gap: 12 (ref 5–15)
BUN: 9 mg/dL (ref 6–20)
CO2: 23 mmol/L (ref 22–32)
Calcium: 8.9 mg/dL (ref 8.9–10.3)
Chloride: 101 mmol/L (ref 98–111)
Creatinine, Ser: 0.78 mg/dL (ref 0.61–1.24)
GFR, Estimated: >60 mL/min (ref >60)
Glucose, Bld: 143 mg/dL — ABNORMAL HIGH (ref 70–99)
Potassium: 3.7 mmol/L (ref 3.5–5.1)
Sodium: 136 mmol/L (ref 135–145)
Total Bilirubin: 2.4 mg/dL — ABNORMAL HIGH (ref 0.3–1.2)
Total Protein: 6.8 g/dL (ref 6.5–8.1)

## 2022-09-12 LAB — LIPASE, BLOOD: Lipase: 15 U/L (ref 11–51)

## 2022-09-12 NOTE — ED Triage Notes (Signed)
Abdominal pain and vomiting since this morning. Upper left to mid

## 2022-09-13 ENCOUNTER — Emergency Department (HOSPITAL_BASED_OUTPATIENT_CLINIC_OR_DEPARTMENT_OTHER): Payer: No Typology Code available for payment source

## 2022-09-13 ENCOUNTER — Observation Stay (HOSPITAL_COMMUNITY): Payer: No Typology Code available for payment source

## 2022-09-13 ENCOUNTER — Inpatient Hospital Stay (HOSPITAL_BASED_OUTPATIENT_CLINIC_OR_DEPARTMENT_OTHER)
Admission: EM | Admit: 2022-09-13 | Discharge: 2022-09-15 | DRG: 814 | Disposition: A | Discharge status: Home / Self Care | Payer: No Typology Code available for payment source | Attending: Internal Medicine | Admitting: Internal Medicine

## 2022-09-13 DIAGNOSIS — R9431 Abnormal electrocardiogram [ECG] [EKG]: Secondary | ICD-10-CM | POA: Diagnosis not present

## 2022-09-13 DIAGNOSIS — D75839 Thrombocytosis, unspecified: Secondary | ICD-10-CM | POA: Diagnosis not present

## 2022-09-13 DIAGNOSIS — E876 Hypokalemia: Secondary | ICD-10-CM | POA: Diagnosis not present

## 2022-09-13 DIAGNOSIS — R109 Unspecified abdominal pain: Secondary | ICD-10-CM | POA: Diagnosis present

## 2022-09-13 DIAGNOSIS — Z888 Allergy status to other drugs, medicaments and biological substances status: Secondary | ICD-10-CM | POA: Diagnosis not present

## 2022-09-13 DIAGNOSIS — R112 Nausea with vomiting, unspecified: Secondary | ICD-10-CM | POA: Diagnosis not present

## 2022-09-13 DIAGNOSIS — D735 Infarction of spleen: Secondary | ICD-10-CM | POA: Diagnosis present

## 2022-09-13 DIAGNOSIS — R7989 Other specified abnormal findings of blood chemistry: Secondary | ICD-10-CM

## 2022-09-13 DIAGNOSIS — M898X8 Other specified disorders of bone, other site: Secondary | ICD-10-CM | POA: Diagnosis not present

## 2022-09-13 DIAGNOSIS — I43 Cardiomyopathy in diseases classified elsewhere: Secondary | ICD-10-CM | POA: Diagnosis not present

## 2022-09-13 DIAGNOSIS — R710 Precipitous drop in hematocrit: Secondary | ICD-10-CM | POA: Diagnosis not present

## 2022-09-13 DIAGNOSIS — F17290 Nicotine dependence, other tobacco product, uncomplicated: Secondary | ICD-10-CM | POA: Diagnosis not present

## 2022-09-13 DIAGNOSIS — E785 Hyperlipidemia, unspecified: Secondary | ICD-10-CM | POA: Diagnosis present

## 2022-09-13 DIAGNOSIS — I110 Hypertensive heart disease with heart failure: Secondary | ICD-10-CM | POA: Diagnosis not present

## 2022-09-13 DIAGNOSIS — R748 Abnormal levels of other serum enzymes: Secondary | ICD-10-CM | POA: Diagnosis present

## 2022-09-13 DIAGNOSIS — M109 Gout, unspecified: Secondary | ICD-10-CM | POA: Diagnosis present

## 2022-09-13 DIAGNOSIS — Z791 Long term (current) use of non-steroidal anti-inflammatories (NSAID): Secondary | ICD-10-CM | POA: Diagnosis not present

## 2022-09-13 DIAGNOSIS — R7303 Prediabetes: Secondary | ICD-10-CM | POA: Diagnosis not present

## 2022-09-13 DIAGNOSIS — Q742 Other congenital malformations of lower limb(s), including pelvic girdle: Secondary | ICD-10-CM

## 2022-09-13 DIAGNOSIS — D72829 Elevated white blood cell count, unspecified: Secondary | ICD-10-CM | POA: Diagnosis present

## 2022-09-13 DIAGNOSIS — R1010 Upper abdominal pain, unspecified: Secondary | ICD-10-CM

## 2022-09-13 DIAGNOSIS — Z79899 Other long term (current) drug therapy: Secondary | ICD-10-CM | POA: Diagnosis not present

## 2022-09-13 DIAGNOSIS — Z8546 Personal history of malignant neoplasm of prostate: Secondary | ICD-10-CM | POA: Diagnosis not present

## 2022-09-13 DIAGNOSIS — I700 Atherosclerosis of aorta: Secondary | ICD-10-CM | POA: Diagnosis not present

## 2022-09-13 DIAGNOSIS — F1010 Alcohol abuse, uncomplicated: Secondary | ICD-10-CM | POA: Diagnosis not present

## 2022-09-13 DIAGNOSIS — I7411 Embolism and thrombosis of thoracic aorta: Secondary | ICD-10-CM | POA: Diagnosis not present

## 2022-09-13 DIAGNOSIS — Z7952 Long term (current) use of systemic steroids: Secondary | ICD-10-CM | POA: Diagnosis not present

## 2022-09-13 DIAGNOSIS — I5021 Acute systolic (congestive) heart failure: Secondary | ICD-10-CM

## 2022-09-13 DIAGNOSIS — R739 Hyperglycemia, unspecified: Secondary | ICD-10-CM | POA: Diagnosis not present

## 2022-09-13 DIAGNOSIS — E7800 Pure hypercholesterolemia, unspecified: Secondary | ICD-10-CM | POA: Diagnosis not present

## 2022-09-13 DIAGNOSIS — K760 Fatty (change of) liver, not elsewhere classified: Secondary | ICD-10-CM | POA: Diagnosis not present

## 2022-09-13 LAB — CBC WITH DIFFERENTIAL/PLATELET
Abs Immature Granulocytes: 0.05 10*3/uL (ref 0.00–0.07)
Basophils Absolute: 0 10*3/uL (ref 0.0–0.1)
Basophils Relative: 0 %
Eosinophils Absolute: 0.1 10*3/uL (ref 0.0–0.5)
Eosinophils Relative: 1 %
HCT: 38 % — ABNORMAL LOW (ref 39.0–52.0)
Hemoglobin: 13 g/dL (ref 13.0–17.0)
Immature Granulocytes: 1 %
Lymphocytes Relative: 22 %
Lymphs Abs: 2.1 10*3/uL (ref 0.7–4.0)
MCH: 30.4 pg (ref 26.0–34.0)
MCHC: 34.2 g/dL (ref 30.0–36.0)
MCV: 88.8 fL (ref 80.0–100.0)
Monocytes Absolute: 1 10*3/uL (ref 0.1–1.0)
Monocytes Relative: 10 %
Neutro Abs: 6.3 10*3/uL (ref 1.7–7.7)
Neutrophils Relative %: 66 %
Platelets: 468 10*3/uL — ABNORMAL HIGH (ref 150–400)
RBC: 4.28 MIL/uL (ref 4.22–5.81)
RDW: 16.5 % — ABNORMAL HIGH (ref 11.5–15.5)
WBC: 9.5 10*3/uL (ref 4.0–10.5)
nRBC: 0 % (ref 0.0–0.2)

## 2022-09-13 LAB — TROPONIN I (HIGH SENSITIVITY)
Troponin I (High Sensitivity): 127 ng/L (ref <18)
Troponin I (High Sensitivity): 126 ng/L (ref <18)

## 2022-09-13 LAB — HEMOGLOBIN A1C
Hgb A1c MFr Bld: 4.9 % (ref 4.8–5.6)
Mean Plasma Glucose: 93.93 mg/dL

## 2022-09-13 LAB — HEPARIN LEVEL (UNFRACTIONATED)
Heparin Unfractionated: 0.16 IU/mL — ABNORMAL LOW (ref 0.30–0.70)
Heparin Unfractionated: 0.38 IU/mL (ref 0.30–0.70)

## 2022-09-13 LAB — SEDIMENTATION RATE: Sed Rate: 11 mm/hr (ref 0–16)

## 2022-09-13 LAB — COMPREHENSIVE METABOLIC PANEL
ALT: 20 U/L (ref 0–44)
AST: 34 U/L (ref 15–41)
Albumin: 3 g/dL — ABNORMAL LOW (ref 3.5–5.0)
Alkaline Phosphatase: 143 U/L — ABNORMAL HIGH (ref 38–126)
Anion gap: 11 (ref 5–15)
BUN: 6 mg/dL (ref 6–20)
CO2: 21 mmol/L — ABNORMAL LOW (ref 22–32)
Calcium: 8.5 mg/dL — ABNORMAL LOW (ref 8.9–10.3)
Chloride: 102 mmol/L (ref 98–111)
Creatinine, Ser: 0.82 mg/dL (ref 0.61–1.24)
GFR, Estimated: >60 mL/min (ref >60)
Glucose, Bld: 103 mg/dL — ABNORMAL HIGH (ref 70–99)
Potassium: 2.9 mmol/L — ABNORMAL LOW (ref 3.5–5.1)
Sodium: 134 mmol/L — ABNORMAL LOW (ref 135–145)
Total Bilirubin: 2.2 mg/dL — ABNORMAL HIGH (ref 0.3–1.2)
Total Protein: 6.2 g/dL — ABNORMAL LOW (ref 6.5–8.1)

## 2022-09-13 LAB — HIV ANTIBODY (ROUTINE TESTING W REFLEX): HIV Screen 4th Generation wRfx: NONREACTIVE

## 2022-09-13 LAB — D-DIMER, QUANTITATIVE: D-Dimer, Quant: 0.71 ug/mL-FEU — ABNORMAL HIGH (ref 0.00–0.50)

## 2022-09-13 LAB — MAGNESIUM: Magnesium: 1.7 mg/dL (ref 1.7–2.4)

## 2022-09-13 MED ORDER — ASPIRIN 81 MG PO CHEW
324.0000 mg | CHEWABLE_TABLET | Freq: Once | ORAL | Status: AC
  Administered 2022-09-13: 324 mg via ORAL
  Filled 2022-09-13: qty 4

## 2022-09-13 MED ORDER — HYDRALAZINE HCL 20 MG/ML IJ SOLN: 10.0000 mg | INTRAMUSCULAR | Status: DC | PRN

## 2022-09-13 MED ORDER — ASPIRIN 81 MG PO TBEC
81.0000 mg | DELAYED_RELEASE_TABLET | Freq: Every day | ORAL | Status: DC
  Administered 2022-09-14 – 2022-09-15 (×2): 81 mg via ORAL
  Filled 2022-09-13 (×2): qty 1

## 2022-09-13 MED ORDER — ACETAMINOPHEN 650 MG RE SUPP: 650.0000 mg | Freq: Four times a day (QID) | RECTAL | Status: DC | PRN

## 2022-09-13 MED ORDER — HYDROMORPHONE HCL 1 MG/ML IJ SOLN
1.0000 mg | Freq: Once | INTRAMUSCULAR | Status: AC
  Administered 2022-09-13: 1 mg via INTRAVENOUS
  Filled 2022-09-13: qty 1

## 2022-09-13 MED ORDER — ONDANSETRON HCL 4 MG PO TABS: 4.0000 mg | ORAL_TABLET | Freq: Four times a day (QID) | ORAL | Status: DC | PRN

## 2022-09-13 MED ORDER — POTASSIUM CHLORIDE CRYS ER 20 MEQ PO TBCR
40.0000 meq | EXTENDED_RELEASE_TABLET | ORAL | Status: AC
  Administered 2022-09-13: 40 meq via ORAL
  Filled 2022-09-13: qty 2

## 2022-09-13 MED ORDER — IOHEXOL 350 MG/ML SOLN
100.0000 mL | Freq: Once | INTRAVENOUS | Status: AC | PRN
  Administered 2022-09-13: 100 mL via INTRAVENOUS

## 2022-09-13 MED ORDER — ADULT MULTIVITAMIN W/MINERALS CH
1.0000 | ORAL_TABLET | Freq: Every day | ORAL | Status: DC
  Administered 2022-09-13 – 2022-09-15 (×3): 1 via ORAL
  Filled 2022-09-13 (×3): qty 1

## 2022-09-13 MED ORDER — FOLIC ACID 1 MG PO TABS
1.0000 mg | ORAL_TABLET | Freq: Every day | ORAL | Status: DC
  Administered 2022-09-13 – 2022-09-15 (×3): 1 mg via ORAL
  Filled 2022-09-13 (×3): qty 1

## 2022-09-13 MED ORDER — LORAZEPAM 1 MG PO TABS: 1.0000 mg | ORAL_TABLET | ORAL | Status: DC | PRN

## 2022-09-13 MED ORDER — ATORVASTATIN CALCIUM 40 MG PO TABS
40.0000 mg | ORAL_TABLET | Freq: Every day | ORAL | Status: DC
  Administered 2022-09-13 – 2022-09-15 (×3): 40 mg via ORAL
  Filled 2022-09-13 (×3): qty 1

## 2022-09-13 MED ORDER — THIAMINE HCL 100 MG/ML IJ SOLN
100.0000 mg | Freq: Every day | INTRAMUSCULAR | Status: DC
  Filled 2022-09-13: qty 2

## 2022-09-13 MED ORDER — CALCIUM GLUCONATE-NACL 1-0.675 GM/50ML-% IV SOLN
1.0000 g | Freq: Once | INTRAVENOUS | Status: AC
  Administered 2022-09-13: 1000 mg via INTRAVENOUS
  Filled 2022-09-13: qty 50

## 2022-09-13 MED ORDER — THIAMINE MONONITRATE 100 MG PO TABS
100.0000 mg | ORAL_TABLET | Freq: Every day | ORAL | Status: DC
  Administered 2022-09-13 – 2022-09-15 (×3): 100 mg via ORAL
  Filled 2022-09-13 (×3): qty 1

## 2022-09-13 MED ORDER — ACETAMINOPHEN 325 MG PO TABS: 650.0000 mg | ORAL_TABLET | Freq: Four times a day (QID) | ORAL | Status: DC | PRN

## 2022-09-13 MED ORDER — SODIUM CHLORIDE 0.9 % IV SOLN: INTRAVENOUS | Status: DC

## 2022-09-13 MED ORDER — HEPARIN (PORCINE) 25000 UT/250ML-% IV SOLN
1550.0000 [IU]/h | INTRAVENOUS | Status: AC
  Administered 2022-09-13: 1550 [IU]/h via INTRAVENOUS
  Administered 2022-09-13: 1300 [IU]/h via INTRAVENOUS
  Administered 2022-09-14 – 2022-09-15 (×2): 1550 [IU]/h via INTRAVENOUS
  Filled 2022-09-13 (×4): qty 250

## 2022-09-13 MED ORDER — FENTANYL CITRATE PF 50 MCG/ML IJ SOSY: 25.0000 ug | PREFILLED_SYRINGE | INTRAMUSCULAR | Status: DC | PRN

## 2022-09-13 MED ORDER — FENTANYL CITRATE PF 50 MCG/ML IJ SOSY
50.0000 ug | PREFILLED_SYRINGE | Freq: Once | INTRAMUSCULAR | Status: AC
  Administered 2022-09-13: 50 ug via INTRAVENOUS
  Filled 2022-09-13: qty 1

## 2022-09-13 MED ORDER — ONDANSETRON HCL 4 MG/2ML IJ SOLN
4.0000 mg | Freq: Once | INTRAMUSCULAR | Status: AC
  Administered 2022-09-13: 4 mg via INTRAVENOUS
  Filled 2022-09-13: qty 2

## 2022-09-13 MED ORDER — HEPARIN BOLUS VIA INFUSION
2000.0000 [IU] | Freq: Once | INTRAVENOUS | Status: AC
  Administered 2022-09-13: 2000 [IU] via INTRAVENOUS
  Filled 2022-09-13: qty 2000

## 2022-09-13 MED ORDER — HYDROCODONE-ACETAMINOPHEN 5-325 MG PO TABS
1.0000 | ORAL_TABLET | ORAL | Status: DC | PRN
  Administered 2022-09-13 – 2022-09-14 (×4): 1 via ORAL
  Filled 2022-09-13 (×4): qty 1

## 2022-09-13 MED ORDER — TRIMETHOBENZAMIDE HCL 100 MG/ML IM SOLN
200.0000 mg | Freq: Four times a day (QID) | INTRAMUSCULAR | Status: DC | PRN
  Filled 2022-09-13: qty 2

## 2022-09-13 MED ORDER — LORAZEPAM 2 MG/ML IJ SOLN: 1.0000 mg | INTRAMUSCULAR | Status: DC | PRN

## 2022-09-13 MED ORDER — ONDANSETRON HCL 4 MG/2ML IJ SOLN: 4.0000 mg | Freq: Four times a day (QID) | INTRAMUSCULAR | Status: DC | PRN

## 2022-09-13 MED ORDER — FEBUXOSTAT 40 MG PO TABS
40.0000 mg | ORAL_TABLET | Freq: Every day | ORAL | Status: DC
  Administered 2022-09-13 – 2022-09-15 (×3): 40 mg via ORAL
  Filled 2022-09-13 (×3): qty 1

## 2022-09-13 MED ORDER — SODIUM CHLORIDE 0.9% FLUSH
3.0000 mL | Freq: Two times a day (BID) | INTRAVENOUS | Status: DC
  Administered 2022-09-13 – 2022-09-15 (×5): 3 mL via INTRAVENOUS

## 2022-09-13 MED ORDER — SODIUM CHLORIDE 0.9 % IV BOLUS
1000.0000 mL | Freq: Once | INTRAVENOUS | Status: AC
  Administered 2022-09-13: 1000 mL via INTRAVENOUS

## 2022-09-13 MED ORDER — ALBUTEROL SULFATE (2.5 MG/3ML) 0.083% IN NEBU: 2.5000 mg | INHALATION_SOLUTION | Freq: Four times a day (QID) | RESPIRATORY_TRACT | Status: DC | PRN

## 2022-09-13 MED ORDER — HEPARIN BOLUS VIA INFUSION
4000.0000 [IU] | Freq: Once | INTRAVENOUS | Status: AC
  Administered 2022-09-13: 4000 [IU] via INTRAVENOUS

## 2022-09-13 MED ORDER — POTASSIUM CHLORIDE CRYS ER 20 MEQ PO TBCR
20.0000 meq | EXTENDED_RELEASE_TABLET | Freq: Every day | ORAL | Status: DC
  Administered 2022-09-13: 20 meq via ORAL
  Filled 2022-09-13: qty 1

## 2022-09-13 MED ORDER — IOHEXOL 300 MG/ML  SOLN
100.0000 mL | Freq: Once | INTRAMUSCULAR | Status: AC | PRN
  Administered 2022-09-13: 100 mL via INTRAVENOUS

## 2022-09-13 NOTE — Progress Notes (Signed)
ANTICOAGULATION CONSULT NOTE - Initial Consult  Pharmacy Consult for heparin Indication: splenic infarcts  Allergies  Allergen Reactions   Allopurinol     Patient Measurements: Height: 5\' 11"  (180.3 cm) Weight: 86 kg (189 lb 11.2 oz) IBW/kg (Calculated) : 75.3 Heparin Dosing Weight: 86kg  Vital Signs: Temp: 98.3 F (36.8 C) (02/03 0200) Temp Source: Oral (02/03 0200) BP: 126/81 (02/03 0445) Pulse Rate: 60 (02/03 0445)  Labs: Recent Labs    09/12/22 2231  HGB 13.4  HCT 40.3  PLT 567*  CREATININE 0.78     Estimated Creatinine Clearance: 104.6 mL/min (by C-G formula based on SCr of 0.78 mg/dL).   Medical History: Past Medical History:  Diagnosis Date   Gout    Hypercholesteremia    Hypertension      Assessment: 89yoM presenting to the ED found to have splenic infarcts. No anticoagulation reported prior to admission. CBC WNL. Pharmacy consulted to dose heparin.    Goal of Therapy:  Heparin level 0.3-0.7 units/ml Monitor platelets by anticoagulation protocol: Yes   Plan:  Give 4000 units bolus x 1 Start heparin infusion at 1300 units/hr Check anti-Xa level in 6 hours and daily while on heparin Continue to monitor H&H and platelets  Onnie Boer, PharmD, BCIDP, AAHIVP, CPP Infectious Disease Pharmacist 09/13/2022 5:07 AM

## 2022-09-13 NOTE — ED Notes (Signed)
Report called to Chrys Racer, RN at [facility] for [phone].

## 2022-09-13 NOTE — Plan of Care (Signed)

## 2022-09-13 NOTE — Progress Notes (Signed)
  Echocardiogram 2D Echocardiogram has been performed.  Albert King 09/13/2022, 2:50 PM

## 2022-09-13 NOTE — Progress Notes (Signed)
ANTICOAGULATION CONSULT NOTE - Follow-up consult  Pharmacy Consult for heparin Indication: splenic infarcts  Allergies  Allergen Reactions   Allopurinol     Patient Measurements: Height: 5\' 11"  (180.3 cm) Weight: 86 kg (189 lb 11.2 oz) IBW/kg (Calculated) : 75.3 Heparin Dosing Weight: 86kg  Vital Signs: Temp: 98.5 F (36.9 C) (02/03 1535) Temp Source: Oral (02/03 1535) BP: 141/75 (02/03 1535) Pulse Rate: 59 (02/03 1535)  Labs: Recent Labs    09/12/22 2231 09/13/22 1229 09/13/22 1900  HGB 13.4 13.0  --   HCT 40.3 38.0*  --   PLT 567* 468*  --   HEPARINUNFRC  --  0.16* 0.38  CREATININE 0.78 0.82  --      Estimated Creatinine Clearance: 102 mL/min (by C-G formula based on SCr of 0.82 mg/dL).   Medical History: Past Medical History:  Diagnosis Date   Gout    Hypercholesteremia    Hypertension      Assessment:  60yoM presenting to the ED found to have splenic infarcts. No anticoagulation reported prior to admission. CBC WNL. Pharmacy consulted to dose heparin.   PM update - heparin level therapeutic at 0.38 after rate increase earlier today. Hg wnl, plt 468. No bleed issues reported.   Goal of Therapy:  Heparin level 0.3-0.7 units/ml Monitor platelets by anticoagulation protocol: Yes   Plan:  Continue heparin infusion at 1550 units/hr Confirmatory heparin level with AM labs Monitor daily CBC, s/sx bleeding   Arturo Morton, PharmD, BCPS Please check AMION for all [facility] contact numbers Clinical Pharmacist 09/13/2022 7:39 PM

## 2022-09-13 NOTE — H&P (Addendum)
History and Physical    Patient: Albert King GHW:[id number] DOB: [birth year] DOA: 09/13/2022 DOS: the patient was seen and examined on 09/13/2022 PCP: Pcp, No  Patient coming from: Home  Chief Complaint:  Chief Complaint  Patient presents with   Abdominal Pain   HPI: Albert King is a 62 y.o. male with medical history significant of hypertension, hyperlipidemia, gout, and prostate cancer s/p TURP in 2021, vape user, and alcohol abuse who presents with complaints of abdominal pain.  Symptoms started yesterday morning with constant pain that was located epigastrically and to the left upper quadrant of his abdomen.  Noted associated symptoms of nausea and vomiting.  Patient reports having at least 5 episodes of emesis later on that day.  Emesis was bile and denies seeing any blood present.  He had not eaten anything prior to the onset of symptoms.  Reported associated symptoms chills.  Denies any recent fever, cough, palpitations, chest pain, diarrhea, or leg swelling.  He had been in his normal state of health, but did report a gout flare in his right foot last week that had since resolved.  He receives most of his care at the [facility] where he had a TURP done for prostate cancer back in 2021.  Patient reports PSAs have been zero with last check 3 months ago.  Patient does admit to vaping and drinks half a gallon of rum and coke every 3 days, but denies any history of withdrawals.   In emergency department patient was noted to be afebrile, pulse 55-85, blood pressures 120/82-166/83, and all other vital signs maintained.  Labs from 2/2 WBC 12.7, platelets 567, alkaline phosphatase 138, total bilirubin 2.4, lipase 15, and D-dimer 0.71.  Urinalysis noted trace protein but did not show any signs of infection.  CT scan of the abdomen pelvis noted regional hypodensities in the spleen suggesting infarction of indeterminate age, hepatic steatosis, and trabecular thickening and sclerosis involving the iliac  bone on the left.  Patient had initially been given fluids, antiemetics, and pain Medications.  Case had been discussed with vascular surgery who recommended obtaining CT angiogram and checking echocardiogram for further workup.  Patient has been placed on a heparin drip.  Review of Systems: As mentioned in the history of present illness. All other systems reviewed and are negative. Past Medical History:  Diagnosis Date   Gout    Hypercholesteremia    Hypertension    Past Surgical History:  Procedure Laterality Date   KNEE SURGERY     PROSTATECTOMY     Social History:  reports that he has been smoking e-cigarettes. He has never used smokeless tobacco. He reports current alcohol use of about 4.0 standard drinks of alcohol per week. He reports that he does not use drugs.  Allergies  Allergen Reactions   Allopurinol     History reviewed. No pertinent family history.  Prior to Admission medications   Medication Sig Start Date End Date Taking? Authorizing Provider  amLODipine (NORVASC) 5 MG tablet Take by mouth. 01/19/19   [provider]  atorvastatin (LIPITOR) 40 MG tablet Take 40 mg by mouth daily.    [provider]  cephALEXin (KEFLEX) 500 MG capsule Take 1 capsule (500 mg total) by mouth 4 (four) times daily. 03/15/15   Veryl Speak, MD  colchicine 0.6 MG tablet Take 2 tablets by mouth once and take 1 tablet by mouth 1 hour later as needed for acute gout flare only 04/22/19   [provider]  diclofenac  Sodium (VOLTAREN) 1 % GEL Place onto the skin.    [provider]  DULoxetine (CYMBALTA) 20 MG capsule Take by mouth. 01/11/19   [provider]  febuxostat (ULORIC) 40 MG tablet Take 40 mg by mouth daily.    [provider]  hydrochlorothiazide (HYDRODIURIL) 25 MG tablet Take 25 mg by mouth daily.    [provider]  indomethacin (INDOCIN) 50 MG capsule Take 1 capsule (50 mg total) by mouth 3 (three) times daily as needed for  moderate pain. 12/24/17   Tegeler, Gwenyth Allegra, MD  losartan (COZAAR) 100 MG tablet Take 100 mg by mouth daily.    [provider]  meloxicam (MOBIC) 15 MG tablet Take 15 mg by mouth daily.    [provider]  oxyCODONE-acetaminophen (PERCOCET) 5-325 MG per tablet Take 1-2 tablets by mouth every 6 (six) hours as needed. 03/15/15   Veryl Speak, MD  oxyCODONE-acetaminophen (PERCOCET/ROXICET) 5-325 MG tablet Take 1 tablet by mouth every 4 (four) hours as needed for severe pain. 12/24/17   Tegeler, Gwenyth Allegra, MD  potassium chloride (K-DUR,KLOR-CON) 10 MEQ tablet Take 10 mEq by mouth 2 (two) times daily.    [provider]  predniSONE (DELTASONE) 50 MG tablet Take 1 tablet (50 mg total) by mouth daily. 07/21/20   Dorie [facility], MD  sildenafil (VIAGRA) 100 MG tablet Take by mouth. 01/11/19   [provider]  tamsulosin (FLOMAX) 0.4 MG CAPS capsule Take 0.4 mg by mouth.    [provider]  UNKNOWN TO PATIENT     [provider]    Physical Exam: Vitals:   09/13/22 0445 09/13/22 0500 09/13/22 0550 09/13/22 0600  BP: 126/81 125/83 (!) 152/95   Pulse: 60 65 76   Resp:   19   Temp:    98.3 F (36.8 C)  TempSrc:      SpO2: 97% 96% 99%   Weight:      Height: 5\' 11"  (1.803 m)      Constitutional: Older adult male currently in no acute distress  Eyes: PERRL, lids and conjunctivae normal ENMT: Mucous membranes are moist.  Fair dentition. Neck: normal, supple  Respiratory: clear to auscultation bilaterally, no wheezing, no crackles. Normal respiratory effort. No accessory muscle use.  Cardiovascular: Regular rate and rhythm, no murmurs / rubs / gallops. No extremity edema. 2+ pedal pulses. No carotid bruits.  Abdomen: Mild tenderness epigastrically.  Bowel sounds present all 4 quadrants Musculoskeletal: no clubbing / cyanosis. No joint deformity upper and lower extremities. Good ROM, no contractures. Normal muscle tone.  Skin: no rashes, lesions,  ulcers. No induration Neurologic: CN 2-12 grossly intact.  Strength 5/5 in all 4.  Psychiatric: Normal judgment and insight. Alert and oriented x 3. Normal mood.   Data Reviewed:  EKG reveals sinus rhythm at 55 bpm with prolonged QT of 618.  Reviewed labs, imaging, and pertinent records as noted above in HPI  Assessment and Plan:  Abdominal pain with nausea and vomiting Acute.  Patient presented with complaints of epigastric and left upper quadrant abdominal pain.  Initial CT scan of the abdomen pelvis without contrast noted splenic hypodensities concerning for splenic infarcts of indeterminate age.  Lipase was noted to be within normal limits at 15, and no signs of inflammation of the pancreas.  D-dimer was obtained elevated, but VTE unlikely when adjusted for age.  Case had been discussed with vascular surgery who recommended obtaining CT angiogram and checking echocardiogram for further workup.  Patient has  been placed on a heparin drip. CT angiogram of the chest and aorta did not note any signs of a thoracic aortic aneurysm/dissection or central pulmonary embolism, but did note some minimal calcified mural thrombus along the medial wall of the lower thoracic aorta thought to be a chronic process.  On differential also includes the possibility of gastroenteritis. -Admit to a telemetry bed -Continue heparin drip per pharmacy -Regular diet as tolerated per patient request -Check echocardiogram -Tigan as needed for nausea/vomiting due to prolonged QT interval  -Hydrocodone p.o./fentanyl IV as needed for pain -Follow-up cardiac telemetry -Message sent to [facility] Filler to set patient up with Holter monitor -Findings were rediscussed with Dr. Unk Lightning of vascular surgery who recommended patient to be possibly set up with a Holter monitor if  concern for splenic infarcts to rule out atrial fibrillation and place patient on aspirin and statin.  Leukocytosis Thrombocytosis Acute.  WBC 12.7 with  platelet count 567.  Urinalysis did not show signs of infection. -Continue to monitor  Elevated liver function test Acute.  Labs from 2/2 alkaline phosphatase 138 and total bilirubin 2.4.  CT scan of the abdomen pelvis noted hepatic steatosis.  He also reports drinking significant alcohol, but AST and ALT not elevated.  Ratio AST to ALT is not 1.5. -Follow-up repeat CMP today  Prolonged QT interval Acute.  QTc elevated at 618. -Avoid QT prolonging medications -Correct electrolyte abnormalities  History of prostate cancer Patient is status post TURP.  Follows with urology at the [facility].  Last available PSA <0.01 from 12/28/2020 on care everywhere.  Patient reports last PSA check approximately 3 months ago was 0. -[facility]  Prediabetes On admission glucose 143. -Patient requesting regular diet.  Abnormality of iliac bone Patient noted to have trabecular thickening and sclerosis involving the iliac bone on the left concerning for possibility of Paget's disease versus osteoblastic metastasis. -May warrant further workup of findings at [facility]  Gout Patient reported having a flare last week of the right foot that has since resolved. -Continue febuxostat  Hyperlipidemia -Continue atorvastatin  Alcohol abuse Patient reports drinking half a gallon of rum and coke every 3 days.  Denies any history of alcohol withdrawals -CIWA protocols with Ativan IV as needed  Vape user Patient reports vaping.   -Counseled patient on need to quit vaping and the dangers of continuing.  DVT prophylaxis: Heparin drip Advance Care Planning:   Code Status: Full Code    Consults: Discussed Dr. Unk Lightning vascular surgery over the phone  Family Communication: Wife updated at bedside  Severity of Illness: The appropriate patient status for this patient is OBSERVATION. Observation status is judged to be reasonable and necessary in order to provide the required intensity of  service to ensure the patient's safety. The patient's presenting symptoms, physical exam findings, and initial radiographic and laboratory data in the context of their medical condition is felt to place them at decreased risk for further clinical deterioration. Furthermore, it is anticipated that the patient will be medically stable for discharge from the hospital within 2 midnights of admission.   Author: Norval Morton, MD 09/13/2022 7:39 AM  For on call review [email or web address]. Marland Kitchen

## 2022-09-13 NOTE — Progress Notes (Signed)
Received Critical Lab result for Troponin of 127. Physician was contacted via secured chat. Physician placed an order for consult with Cardiology.

## 2022-09-13 NOTE — ED Provider Notes (Signed)
[facility]  Provider Note  CSN: [id number] Arrival date & time: 09/12/22 2124  History Chief Complaint  Patient presents with   Abdominal Pain    Salil Raineri is a 62 y.o. male with prior history of prostatectomy reports he was in his usual state of health until about 11am on the day of arrival when he began to have LLQ abdominal pain, associated with vomiting but no diarrhea or bleeding. Pain has spread to mid abdomen. Denies fever, no urinary symptoms. No prior history of same.    Home Medications Prior to Admission medications   Medication Sig Start Date End Date Taking? Authorizing Provider  amLODipine (NORVASC) 5 MG tablet Take by mouth. 01/19/19   [provider]  atorvastatin (LIPITOR) 40 MG tablet Take 40 mg by mouth daily.    [provider]  cephALEXin (KEFLEX) 500 MG capsule Take 1 capsule (500 mg total) by mouth 4 (four) times daily. 03/15/15   Veryl Speak, MD  colchicine 0.6 MG tablet Take 2 tablets by mouth once and take 1 tablet by mouth 1 hour later as needed for acute gout flare only 04/22/19   [provider]  diclofenac Sodium (VOLTAREN) 1 % GEL Place onto the skin.    [provider]  DULoxetine (CYMBALTA) 20 MG capsule Take by mouth. 01/11/19   [provider]  febuxostat (ULORIC) 40 MG tablet Take 40 mg by mouth daily.    [provider]  hydrochlorothiazide (HYDRODIURIL) 25 MG tablet Take 25 mg by mouth daily.    [provider]  indomethacin (INDOCIN) 50 MG capsule Take 1 capsule (50 mg total) by mouth 3 (three) times daily as needed for moderate pain. 12/24/17   Tegeler, Gwenyth Allegra, MD  losartan (COZAAR) 100 MG tablet Take 100 mg by mouth daily.    [provider]  meloxicam (MOBIC) 15 MG tablet Take 15 mg by mouth daily.    [provider]  oxyCODONE-acetaminophen (PERCOCET) 5-325 MG per tablet Take 1-2 tablets by mouth every 6 (six)  hours as needed. 03/15/15   Veryl Speak, MD  oxyCODONE-acetaminophen (PERCOCET/ROXICET) 5-325 MG tablet Take 1 tablet by mouth every 4 (four) hours as needed for severe pain. 12/24/17   Tegeler, Gwenyth Allegra, MD  potassium chloride (K-DUR,KLOR-CON) 10 MEQ tablet Take 10 mEq by mouth 2 (two) times daily.    [provider]  predniSONE (DELTASONE) 50 MG tablet Take 1 tablet (50 mg total) by mouth daily. 07/21/20   Dorie Rank, MD  sildenafil (VIAGRA) 100 MG tablet Take by mouth. 01/11/19   [provider]  tamsulosin (FLOMAX) 0.4 MG CAPS capsule Take 0.4 mg by mouth.    [provider]  UNKNOWN TO PATIENT     [provider]     Allergies    Allopurinol   Review of Systems   Review of Systems Please see HPI for pertinent positives and negatives  Physical Exam BP 125/83 (BP Location: Right Arm)   Pulse 65   Temp 98.3 F (36.8 C)   Resp 18   Ht 5\' 11"  (1.803 m)   Wt 86 kg   SpO2 96%   BMI 26.46 kg/m   Physical Exam Vitals and nursing note reviewed.  Constitutional:      Appearance: Normal appearance.  HENT:     Head: Normocephalic and atraumatic.     Nose: Nose normal.     Mouth/Throat:     Mouth: Mucous membranes are  moist.  Eyes:     Extraocular Movements: Extraocular movements intact.     Conjunctiva/sclera: Conjunctivae normal.  Cardiovascular:     Rate and Rhythm: Normal rate.  Pulmonary:     Effort: Pulmonary effort is normal.     Breath sounds: Normal breath sounds.  Abdominal:     General: Abdomen is flat.     Palpations: Abdomen is soft.     Tenderness: There is abdominal tenderness in the left lower quadrant. There is no guarding. Negative signs include Murphy's sign and McBurney's sign.  Musculoskeletal:        General: No swelling. Normal range of motion.     Cervical back: Neck supple.  Skin:    General: Skin is warm and dry.  Neurological:     General: No focal deficit present.     Mental Status: He is alert.   Psychiatric:        Mood and Affect: Mood normal.     ED Results / Procedures / Treatments   EKG EKG Interpretation  Date/Time:  Saturday September 13 2022 02:54:27 EST Ventricular Rate:  55 PR Interval:  164 QRS Duration: 89 QT Interval:  645 QTC Calculation: 618 R Axis:   35 Text Interpretation: Sinus rhythm Abnormal R-wave progression, early transition Repol abnrm Prolonged QT interval No old tracing to compare Confirmed by Calvert Cantor [phone]) on 09/13/2022 3:06:05 AM  Procedures Procedures  Medications Ordered in the ED Medications  heparin bolus via infusion 4,000 Units (4,000 Units Intravenous Bolus from Bag 09/13/22 0517)    Followed by  heparin ADULT infusion 100 units/mL (25000 units/270mL) (1,300 Units/hr Intravenous New Bag/Given 09/13/22 0515)  fentaNYL (SUBLIMAZE) injection 50 mcg (50 mcg Intravenous Given 09/13/22 0141)  ondansetron (ZOFRAN) injection 4 mg (4 mg Intravenous Given 09/13/22 0141)  sodium chloride 0.9 % bolus 1,000 mL (0 mLs Intravenous Stopped 09/13/22 0320)  iohexol (OMNIPAQUE) 300 MG/ML solution 100 mL (100 mLs Intravenous Contrast Given 09/13/22 0202)  HYDROmorphone (DILAUDID) injection 1 mg (1 mg Intravenous Given 09/13/22 0416)  ondansetron (ZOFRAN) injection 4 mg (4 mg Intravenous Given 09/13/22 0415)  iohexol (OMNIPAQUE) 350 MG/ML injection 100 mL (100 mLs Intravenous Contrast Given 09/13/22 0424)    Initial Impression and Plan  Patient with LLQ abdominal pain and vomiting. Consider diverticulitis, colitis, less likely obstruction. Labs done in triage show CBC with mild leukocytosis, CMP with mildly elevated bili but normal LFTs otherwise. Lipase is normal. UA is clear. Plan CT for further evaluation. Pain/nausea meds and IVF for comfort.   ED Course   Clinical Course as of 09/13/22 3086  Sat Sep 13, 2022  5784 I personally viewed the images from radiology studies and agree with radiologist interpretation: CT is concerning for splenic infarcts. No  history of afib or other clotting disorders. Has had neg PSA at recent urology followups. Pain currently well controlled. Will check EKG for occult afib although sounds regular. Admit for further evaluation [CS]  0303 Spoke with Dr. Bridgett Larsson, hospitalist, who requests we add on a Dimer to try to ascertain if CT findings are acute or not.  [CS]  6962 Dimer is mildly elevated, even with age correction. Dr. Bridgett Larsson asks that I speak with Vascular for their input as well.  [CS]  [phone] Spoke with Dr. Virl Cagey, Vascular, he recommends CTA Chest while here to eval aortic thrombus also will likely need echo to eval atrial thrombus, Dr. Bridgett Larsson aware, will place bed request. Heparin started.  [CS]    Clinical Course User  Index [CS] Truddie Hidden, MD     MDM Rules/Calculators/A&P Medical Decision Making Problems Addressed: Splenic infarct: acute illness or injury  Amount and/or Complexity of Data Reviewed Labs: ordered. Decision-making details documented in ED Course. Radiology: ordered and independent interpretation performed. Decision-making details documented in ED Course.  Risk Prescription drug management. Parenteral controlled substances. Decision regarding hospitalization.     Final Clinical Impression(s) / ED Diagnoses Final diagnoses:  Splenic infarct    Rx / DC Orders ED Discharge Orders     None        Truddie Hidden, MD 09/13/22 [phone]

## 2022-09-13 NOTE — Progress Notes (Signed)
ANTICOAGULATION CONSULT NOTE - Follow-up consult  Pharmacy Consult for heparin Indication: splenic infarcts  Allergies  Allergen Reactions   Allopurinol     Patient Measurements: Height: 5\' 11"  (180.3 cm) Weight: 86 kg (189 lb 11.2 oz) IBW/kg (Calculated) : 75.3 Heparin Dosing Weight: 86kg  Vital Signs: Temp: 98.7 F (37.1 C) (02/03 1103) Temp Source: Oral (02/03 1103) BP: 133/76 (02/03 1103) Pulse Rate: 58 (02/03 1103)  Labs: Recent Labs    09/12/22 2231 09/13/22 1229  HGB 13.4 13.0  HCT 40.3 38.0*  PLT 567* 468*  HEPARINUNFRC  --  0.16*  CREATININE 0.78 0.82     Estimated Creatinine Clearance: 102 mL/min (by C-G formula based on SCr of 0.82 mg/dL).   Medical History: Past Medical History:  Diagnosis Date   Gout    Hypercholesteremia    Hypertension      Assessment: 80yoM presenting to the ED found to have splenic infarcts. No anticoagulation reported prior to admission. CBC WNL. Pharmacy consulted to dose heparin.   Initial HL 0.16 with heparin running at 1,300 units/hour.    Goal of Therapy:  Heparin level 0.3-0.7 units/ml Monitor platelets by anticoagulation protocol: Yes   Plan:  Give 2,000 units bolus x 1 Increase heparin infusion to 1,550 units/h Check anti-Xa level in 6 hours and daily while on heparin Continue to monitor H&H and platelets  Adria Dill, PharmD PGY-2 Infectious Diseases Resident  09/13/2022 1:25 PM

## 2022-09-13 NOTE — Plan of Care (Signed)
**  Update. D-dimer slightly elevated. I advised EDP to contact vascular surgery, who recommended CTA and echo.  Observation med/surg bed order placed ** Patient Name: Albert King, Albert King DOB: January 17, 1961 MRN: [id number] Transferring facility: [facility] Requesting provider: sheldon, md Reason for transfer:  62 yo male with prior hx of prostate cancer s/p TURP with left flank pain today. no prior hx of VTE.  ct abd shows splenic infarcts. unclear if these are new vs old. requested EDP to send D-dimer. if D-dimer is normal, then these splenic infarcts are old and pt can be discharged to home. If D-dimer elevated, pt will need heparin gtts and admission for further workup. Going to: Admission Status: admission deferred for now Bed Type: To Do:  [facility] will assume care on arrival to accepting facility. Until arrival, medical decision making responsibilities remain with the EDP.  However, [facility] available 24/7 for questions and assistance.   Nursing staff please page [facility] and Consults [phone]) as soon as the patient arrives to the hospital.  Kristopher Oppenheim, DO [facility]

## 2022-09-13 NOTE — Progress Notes (Signed)
Following up the echocardiogram revealed EF to be reduced to 35 to 40% with grade 1 diastolic dysfunction.  Question if patient's symptoms were actually secondary to chest pain and high-sensitivity troponins were added on which were noted to be elevated at 127.  Patient had been placed on heparin drip initially due to concern for splenic infarcts, but was not discontinued in the setting of the mildly elevated D-dimer.  Dr. Koleen Nimrod of Cardiology consulted to see.

## 2022-09-13 NOTE — Progress Notes (Signed)
Patient arrived to the unit via stretcher from [facility], patient is alert and ambulatory, denies pain at this time. Pt was made comfortable in his room, bed at lowest position. Call light is within reach.

## 2022-09-13 NOTE — Consult Note (Signed)
Cardiology Consultation   Patient ID: Albert King MRN: [id number]; DOB: 1961/01/29  Admit date: 09/13/2022 Date of Consult: 09/13/2022  PCP:  Merryl Hacker, No   [facility] Providers Cardiologist:  None   { Click here to update MD or APP on Care Team, Refresh:1}     Patient Profile:   Albert King is a 62 y.o. male with a hx of hypertension, hypercholesteremia, gout, prostate cancer status transurethral  prostectomy, e-cigarette use, and reported alcohol abuse who is being seen 09/13/2022 for the evaluation of abdominal at the request of Hospital Medicine.  History of Present Illness:   Mr. Hoheisel ***  Patient had an abdominal CT performed as part of his evaluation for abdominal pain that demonstrated minimal calcified mural thrombus along the medial wall of the lower thoracic aorta thought chronic, but no concern for an acute arterial process.  An echocardiogram was performed and demonstrated left ventricular systolic heart failure with a reported EF 35%-40%.   Past Medical History:  Diagnosis Date   Gout    Hypercholesteremia    Hypertension     Past Surgical History:  Procedure Laterality Date   KNEE SURGERY     PROSTATECTOMY       {Home Medications (Optional):21181}  Inpatient Medications: Scheduled Meds:  [START ON 09/14/2022] aspirin EC  81 mg Oral Daily   atorvastatin  40 mg Oral Daily   febuxostat  40 mg Oral Daily   folic acid  1 mg Oral Daily   multivitamin with minerals  1 tablet Oral Daily   potassium chloride  20 mEq Oral Daily   sodium chloride flush  3 mL Intravenous Q12H   thiamine  100 mg Oral Daily   Or   thiamine  100 mg Intravenous Daily   Continuous Infusions:  heparin 1,550 Units/hr (09/13/22 1834)   PRN Meds: acetaminophen **OR** acetaminophen, albuterol, fentaNYL (SUBLIMAZE) injection, hydrALAZINE, HYDROcodone-acetaminophen, LORazepam **OR** LORazepam, trimethobenzamide  Allergies:    Allergies  Allergen Reactions   Allopurinol      Social History:   Social History   Socioeconomic History   Marital status: Married    Spouse name: Not on file   Number of children: Not on file   Years of education: Not on file   Highest education level: Not on file  Occupational History   Not on file  Tobacco Use   Smoking status: Every Day    Types: E-cigarettes   Smokeless tobacco: Never  Vaping Use   Vaping Use: Every day  Substance and Sexual Activity   Alcohol use: Yes    Alcohol/week: 4.0 standard drinks of alcohol    Types: 4 Shots of liquor per week   Drug use: No   Sexual activity: Not on file  Other Topics Concern   Not on file  Social History Narrative   Not on file   Social Determinants of Health   Financial Resource Strain: Not on file  Food Insecurity: Not on file  Transportation Needs: Not on file  Physical Activity: Not on file  Stress: Not on file  Social Connections: Not on file  Intimate Partner Violence: Not on file    Family History:   ***History reviewed. No pertinent family history.   ROS:  Please see the history of present illness.  *** All other ROS reviewed and negative.     Physical Exam/Data:   Vitals:   09/13/22 0740 09/13/22 1103 09/13/22 1535 09/13/22 2208  BP: (!) 147/81 133/76 (!) 141/75 (!) 148/82  Pulse: (!) 59 (!) 58 (!) 59 (!) 55  Resp: 17 16 17 18   Temp: 97.8 F (36.6 C) 98.7 F (37.1 C) 98.5 F (36.9 C) 98.1 F (36.7 C)  TempSrc: Oral Oral Oral Oral  SpO2: 100% 100% 100% 100%  Weight:      Height:        Intake/Output Summary (Last 24 hours) at 09/13/2022 2329 Last data filed at 09/13/2022 2151 Gross per 24 hour  Intake 1300.27 ml  Output --  Net 1300.27 ml      09/13/2022    4:15 AM 07/21/2020    8:52 AM 03/15/2015    8:49 PM  Last 3 Weights  Weight (lbs) 189 lb 11.2 oz 225 lb 220 lb  Weight (kg) 86.047 kg 102.059 kg 99.791 kg     Body mass index is 26.46 kg/m.  General:  Well nourished, well developed, in no acute distress*** HEENT:  normal Neck: no JVD Vascular: No carotid bruits; Distal pulses 2+ bilaterally Cardiac:  normal S1, S2; RRR; no murmur *** Lungs:  clear to auscultation bilaterally, no wheezing, rhonchi or rales  Abd: soft, nontender, no hepatomegaly  Ext: no edema Musculoskeletal:  No deformities, BUE and BLE strength normal and equal Skin: warm and dry  Neuro:  CNs 2-12 intact, no focal abnormalities noted Psych:  Normal affect   EKG:  The EKG was personally reviewed and demonstrates:  *** Telemetry:  Telemetry was personally reviewed and demonstrates:  ***  Relevant CV Studies: ***  Laboratory Data:  High Sensitivity Troponin:   Recent Labs  Lab 09/13/22 2012 09/13/22 2205  TROPONINIHS 127* 126*     Chemistry Recent Labs  Lab 09/12/22 2231 09/13/22 1229  NA 136 134*  K 3.7 2.9*  CL 101 102  CO2 23 21*  GLUCOSE 143* 103*  BUN 9 6  CREATININE 0.78 0.82  CALCIUM 8.9 8.5*  MG  --  1.7  GFRNONAA >60 >60  ANIONGAP 12 11    Recent Labs  Lab 09/12/22 2231 09/13/22 1229  PROT 6.8 6.2*  ALBUMIN 3.5 3.0*  AST 28 34  ALT 20 20  ALKPHOS 138* 143*  BILITOT 2.4* 2.2*   Lipids No results for input(s): "CHOL", "TRIG", "HDL", "LABVLDL", "LDLCALC", "CHOLHDL" in the last 168 hours.  Hematology Recent Labs  Lab 09/12/22 2231 09/13/22 1229  WBC 12.7* 9.5  RBC 4.58 4.28  HGB 13.4 13.0  HCT 40.3 38.0*  MCV 88.0 88.8  MCH 29.3 30.4  MCHC 33.3 34.2  RDW 16.5* 16.5*  PLT 567* 468*   Thyroid No results for input(s): "TSH", "FREET4" in the last 168 hours.  BNPNo results for input(s): "BNP", "PROBNP" in the last 168 hours.  DDimer  Recent Labs  Lab 09/13/22 0318  DDIMER 0.71*     Radiology/Studies:  ECHOCARDIOGRAM COMPLETE  Result Date: 09/13/2022    ECHOCARDIOGRAM REPORT   Patient Name:   Albert King Date of Exam: 09/13/2022 Medical Rec #:  [id number]      Height:       71.0 in Accession #:    [id number]     Weight:       189.7 lb Date of Birth:  1961/03/19       BSA:           2.062 m Patient Age:    26 years       BP:           133/76 mmHg Patient Gender: M  HR:           55 bpm. Exam Location:  Inpatient Procedure: 2D Echo, Cardiac Doppler and Color Doppler Indications:    Abnormal ECG                 Splenic infarct  History:        Patient has no prior history of Echocardiogram examinations.                 Risk Factors:Hypertension and Dyslipidemia.  Sonographer:    Clayton Lefort RDCS (AE) Referring Phys: Eustaquio Boyden A SMITH IMPRESSIONS  1. Left ventricular ejection fraction, by estimation, is 35 to 40%. The left ventricle has moderately decreased function. The left ventricle demonstrates global hypokinesis. Left ventricular diastolic parameters are consistent with Grade I diastolic dysfunction (impaired relaxation). There is incoordinate septal motion.  2. Right ventricular systolic function is normal. The right ventricular size is normal. Tricuspid regurgitation signal is inadequate for assessing PA pressure.  3. The mitral valve is abnormal. Trivial mitral valve regurgitation.  4. The aortic valve is tricuspid. Aortic valve regurgitation is not visualized.  5. The inferior vena cava is dilated in size with >50% respiratory variability, suggesting right atrial pressure of 8 mmHg. Comparison(s): No prior Echocardiogram. FINDINGS  Left Ventricle: Left ventricular ejection fraction, by estimation, is 35 to 40%. The left ventricle has moderately decreased function. The left ventricle demonstrates global hypokinesis. The left ventricular internal cavity size was normal in size. There is no left ventricular hypertrophy. Incoordinate septal motion. Left ventricular diastolic parameters are consistent with Grade I diastolic dysfunction (impaired relaxation). Indeterminate filling pressures. Right Ventricle: The right ventricular size is normal. No increase in right ventricular wall thickness. Right ventricular systolic function is normal. Tricuspid regurgitation signal is inadequate  for assessing PA pressure. Left Atrium: Left atrial size was normal in size. Right Atrium: Right atrial size was normal in size. Pericardium: There is no evidence of pericardial effusion. Mitral Valve: The mitral valve is abnormal. There is mild thickening of the anterior and posterior mitral valve leaflet(s). Trivial mitral valve regurgitation. Tricuspid Valve: The tricuspid valve is grossly normal. Tricuspid valve regurgitation is not demonstrated. Aortic Valve: The aortic valve is tricuspid. Aortic valve regurgitation is not visualized. Aortic valve mean gradient measures 3.0 mmHg. Aortic valve peak gradient measures 5.6 mmHg. Aortic valve area, by VTI measures 2.96 cm. Pulmonic Valve: The pulmonic valve was normal in structure. Pulmonic valve regurgitation is not visualized. Aorta: The aortic root and ascending aorta are structurally normal, with no evidence of dilitation. Venous: The inferior vena cava is dilated in size with greater than 50% respiratory variability, suggesting right atrial pressure of 8 mmHg. IAS/Shunts: No atrial level shunt detected by color flow Doppler.  LEFT VENTRICLE PLAX 2D LVIDd:         5.20 cm     Diastology LVIDs:         3.30 cm     LV e' medial:    6.85 cm/s LV PW:         1.00 cm     LV E/e' medial:  8.7 LV IVS:        1.00 cm     LV e' lateral:   5.00 cm/s LVOT diam:     2.40 cm     LV E/e' lateral: 11.9 LV SV:         74 LV SV Index:   36 LVOT Area:     4.52 cm  LV Volumes (  MOD) LV vol d, MOD A2C: 88.2 ml LV vol d, MOD A4C: 90.3 ml LV vol s, MOD A2C: 53.6 ml LV vol s, MOD A4C: 64.0 ml LV SV MOD A2C:     34.6 ml LV SV MOD A4C:     90.3 ml LV SV MOD BP:      30.1 ml RIGHT VENTRICLE          IVC RV Basal diam:  2.60 cm  IVC diam: 2.20 cm TAPSE (M-mode): 2.2 cm LEFT ATRIUM           Index        RIGHT ATRIUM           Index LA diam:      3.60 cm 1.75 cm/m   RA Area:     15.20 cm LA Vol (A2C): 36.8 ml 17.85 ml/m  RA Volume:   39.10 ml  18.96 ml/m LA Vol (A4C): 58.9 ml 28.57  ml/m  AORTIC VALVE AV Area (Vmax):    3.24 cm AV Area (Vmean):   3.04 cm AV Area (VTI):     2.96 cm AV Vmax:           118.00 cm/s AV Vmean:          75.500 cm/s AV VTI:            0.251 m AV Peak Grad:      5.6 mmHg AV Mean Grad:      3.0 mmHg LVOT Vmax:         84.40 cm/s LVOT Vmean:        50.800 cm/s LVOT VTI:          0.164 m LVOT/AV VTI ratio: 0.65  AORTA Ao Root diam: 3.30 cm Ao Asc diam:  3.30 cm MITRAL VALVE MV Area (PHT): 2.86 cm    SHUNTS MV Decel Time: 265 msec    Systemic VTI:  0.16 m MV E velocity: 59.70 cm/s  Systemic Diam: 2.40 cm MV A velocity: 61.80 cm/s MV E/A ratio:  0.97 Lyman Bishop MD Electronically signed by Lyman Bishop MD Signature Date/Time: 09/13/2022/3:27:57 PM    Final    CT Angio Chest Aorta W and/or Wo Contrast  Result Date: 09/13/2022 CLINICAL DATA:  Acute aortic syndrome suspected. Splenic infarct on abdominal CT earlier same day EXAM: CT ANGIOGRAPHY CHEST WITH CONTRAST TECHNIQUE: Multidetector CT imaging of the chest was performed using the standard protocol during bolus administration of intravenous contrast. Multiplanar CT image reconstructions and MIPs were obtained to evaluate the vascular anatomy. RADIATION DOSE REDUCTION: This exam was performed according to the departmental dose-optimization program which includes automated exposure control, adjustment of the mA and/or kV according to patient size and/or use of iterative reconstruction technique. CONTRAST:  128mL OMNIPAQUE IOHEXOL 350 MG/ML SOLN COMPARISON:  CT abdomen/pelvis from earlier same day FINDINGS: Cardiovascular: Ascending thoracic aorta measures 3.3 cm diameter. Descending thoracic aorta is 2.6 cm diameter. Mild atherosclerotic calcification is noted in the wall of the thoracic aorta. No evidence for dissection flap within the thoracic aorta. Aortic arch vessel branch anatomy is widely patent. There is some minimal calcified mural thrombus along the medial wall of the lower thoracic aorta consistent with  chronic process (axial 108/4). Enlargement of the pulmonary outflow tract/main pulmonary arteries suggests pulmonary arterial hypertension. No evidence for central pulmonary embolus in the main pulmonary arteries, lobar pulmonary arteries, or segmental pulmonary arteries. Imaging on the study was carried down into the mid abdomen and the celiac axis  and SMA are widely patent. Splenic artery appears widely patent without appreciable atherosclerotic disease, filling defect or aneurysm. Mediastinum/Nodes: No mediastinal lymphadenopathy. There is no hilar lymphadenopathy. The esophagus has normal imaging features. There is no axillary lymphadenopathy. Lungs/Pleura: No focal airspace consolidation. No suspicious pulmonary nodule or mass. No pulmonary edema or pleural effusion. Upper Abdomen: Deferred to dedicated abdomen and pelvis CT exam performed immediately prior to this study. Musculoskeletal: No worrisome lytic or sclerotic osseous abnormality. Review of the MIP images confirms the above findings. IMPRESSION: 1. No evidence for thoracic aortic aneurysm or dissection. There is some minimal calcified mural thrombus along the medial wall of the lower thoracic aorta consistent with chronic process. 2. Enlargement of the pulmonary outflow tract/main pulmonary arteries suggests pulmonary arterial hypertension. 3. No evidence for central pulmonary embolus. Electronically Signed   By: Kennith Center M.D.   On: 09/13/2022 05:18   CT Abdomen Pelvis W Contrast  Result Date: 09/13/2022 CLINICAL DATA:  Left lower quadrant abdominal pain. EXAM: CT ABDOMEN AND PELVIS WITH CONTRAST TECHNIQUE: Multidetector CT imaging of the abdomen and pelvis was performed using the standard protocol following bolus administration of intravenous contrast. RADIATION DOSE REDUCTION: This exam was performed according to the departmental dose-optimization program which includes automated exposure control, adjustment of the mA and/or kV according to  patient size and/or use of iterative reconstruction technique. CONTRAST:  OMNIPAQUE IOHEXOL 300 MG/ML  SOLN COMPARISON:  06/05/2009. FINDINGS: Lower chest: No acute abnormality. Hepatobiliary: No focal liver abnormality is seen. Fatty infiltration of the liver is noted. No gallstones, gallbladder wall thickening, or biliary dilatation. Pancreas: Unremarkable. No pancreatic ductal dilatation or surrounding inflammatory changes. Spleen: Multifocal hypodense regions are present in the spleen, suspicious for infarcts. No subcapsular hematoma is seen. Adrenals/Urinary Tract: The adrenal glands are within normal limits. Kidneys enhance symmetrically. No renal calculus or hydronephrosis. Bladder is unremarkable. Stomach/Bowel: Stomach is within normal limits. Appendix appears normal. No evidence of bowel wall thickening, distention, or inflammatory changes. No free air or pneumatosis. Vascular/Lymphatic: Aortic atherosclerosis. No enlarged abdominal or pelvic lymph nodes. Reproductive: The prostate gland is not seen. Other: No abdominopelvic ascites. Musculoskeletal: Trabecular thickening and sclerosis are noted in the iliac bone on the left, suggesting Paget's disease. Mild degenerative changes in the thoracolumbar spine. No acute osseous abnormality IMPRESSION: 1. Regional hypodensities in the spleen, suggesting infarcts and indeterminate in age. 2. Hepatic steatosis. 3. Trabecular thickening and sclerosis involving the iliac bone on the left, possible Paget's disease versus osteoblastic metastasis. Electronically Signed   By: Thornell Sartorius M.D.   On: 09/13/2022 02:30     Assessment and Plan:  Albert King is a 62 y.o. male with a hx of hypertension, hypercholesteremia, gout, prostate cancer status transurethral  prostectomy, e-cigarette use, and reported alcohol abuse who is being seen 09/13/2022 for the evaluation of abdominal at the request of Hospital Medicine.  ***   Risk Assessment/Risk Scores:   {Complete the following score calculators/questions to meet required metrics.  Press F2         :[id number]}   {Is the patient being seen for unstable angina, ACS, NSTEMI or STEMI?[phone]} {Does this patient have CHF or CHF symptoms?      :[id number]} {Does this patient have ATRIAL FIBRILLATION?:[phone]}  {Are we signing off today?:[id number]}  For questions or updates, please contact [facility] Please consult [email or web address] for contact info under    Signed, Judie Grieve, MD  09/13/2022 11:29 PM

## 2022-09-14 DIAGNOSIS — I700 Atherosclerosis of aorta: Secondary | ICD-10-CM | POA: Diagnosis present

## 2022-09-14 DIAGNOSIS — R739 Hyperglycemia, unspecified: Secondary | ICD-10-CM | POA: Diagnosis not present

## 2022-09-14 DIAGNOSIS — R710 Precipitous drop in hematocrit: Secondary | ICD-10-CM | POA: Diagnosis not present

## 2022-09-14 DIAGNOSIS — F1010 Alcohol abuse, uncomplicated: Secondary | ICD-10-CM | POA: Diagnosis present

## 2022-09-14 DIAGNOSIS — K760 Fatty (change of) liver, not elsewhere classified: Secondary | ICD-10-CM | POA: Diagnosis present

## 2022-09-14 DIAGNOSIS — D735 Infarction of spleen: Principal | ICD-10-CM | POA: Diagnosis present

## 2022-09-14 DIAGNOSIS — M898X8 Other specified disorders of bone, other site: Secondary | ICD-10-CM | POA: Diagnosis present

## 2022-09-14 DIAGNOSIS — Z7952 Long term (current) use of systemic steroids: Secondary | ICD-10-CM | POA: Diagnosis not present

## 2022-09-14 DIAGNOSIS — I5021 Acute systolic (congestive) heart failure: Secondary | ICD-10-CM

## 2022-09-14 DIAGNOSIS — D75839 Thrombocytosis, unspecified: Secondary | ICD-10-CM | POA: Diagnosis present

## 2022-09-14 DIAGNOSIS — I43 Cardiomyopathy in diseases classified elsewhere: Secondary | ICD-10-CM | POA: Diagnosis present

## 2022-09-14 DIAGNOSIS — E7800 Pure hypercholesterolemia, unspecified: Secondary | ICD-10-CM | POA: Diagnosis present

## 2022-09-14 DIAGNOSIS — E876 Hypokalemia: Secondary | ICD-10-CM | POA: Diagnosis present

## 2022-09-14 DIAGNOSIS — Z791 Long term (current) use of non-steroidal anti-inflammatories (NSAID): Secondary | ICD-10-CM | POA: Diagnosis not present

## 2022-09-14 DIAGNOSIS — R7989 Other specified abnormal findings of blood chemistry: Secondary | ICD-10-CM | POA: Diagnosis not present

## 2022-09-14 DIAGNOSIS — M109 Gout, unspecified: Secondary | ICD-10-CM | POA: Diagnosis present

## 2022-09-14 DIAGNOSIS — I110 Hypertensive heart disease with heart failure: Secondary | ICD-10-CM | POA: Diagnosis present

## 2022-09-14 DIAGNOSIS — R7303 Prediabetes: Secondary | ICD-10-CM | POA: Diagnosis present

## 2022-09-14 DIAGNOSIS — Z8546 Personal history of malignant neoplasm of prostate: Secondary | ICD-10-CM | POA: Diagnosis not present

## 2022-09-14 DIAGNOSIS — Z79899 Other long term (current) drug therapy: Secondary | ICD-10-CM | POA: Diagnosis not present

## 2022-09-14 DIAGNOSIS — Z888 Allergy status to other drugs, medicaments and biological substances status: Secondary | ICD-10-CM | POA: Diagnosis not present

## 2022-09-14 DIAGNOSIS — F17290 Nicotine dependence, other tobacco product, uncomplicated: Secondary | ICD-10-CM | POA: Diagnosis present

## 2022-09-14 DIAGNOSIS — I7411 Embolism and thrombosis of thoracic aorta: Secondary | ICD-10-CM | POA: Diagnosis present

## 2022-09-14 DIAGNOSIS — D72829 Elevated white blood cell count, unspecified: Secondary | ICD-10-CM | POA: Diagnosis present

## 2022-09-14 LAB — COMPREHENSIVE METABOLIC PANEL
ALT: 18 U/L (ref 0–44)
AST: 28 U/L (ref 15–41)
Albumin: 2.6 g/dL — ABNORMAL LOW (ref 3.5–5.0)
Alkaline Phosphatase: 126 U/L (ref 38–126)
Anion gap: 8 (ref 5–15)
BUN: <5 mg/dL — ABNORMAL LOW (ref 6–20)
CO2: 23 mmol/L (ref 22–32)
Calcium: 8.4 mg/dL — ABNORMAL LOW (ref 8.9–10.3)
Chloride: 106 mmol/L (ref 98–111)
Creatinine, Ser: 0.95 mg/dL (ref 0.61–1.24)
GFR, Estimated: >60 mL/min (ref >60)
Glucose, Bld: 83 mg/dL (ref 70–99)
Potassium: 3.7 mmol/L (ref 3.5–5.1)
Sodium: 137 mmol/L (ref 135–145)
Total Bilirubin: 1.9 mg/dL — ABNORMAL HIGH (ref 0.3–1.2)
Total Protein: 5.5 g/dL — ABNORMAL LOW (ref 6.5–8.1)

## 2022-09-14 LAB — CBC
HCT: 37.3 % — ABNORMAL LOW (ref 39.0–52.0)
Hemoglobin: 12.1 g/dL — ABNORMAL LOW (ref 13.0–17.0)
MCH: 29.9 pg (ref 26.0–34.0)
MCHC: 32.4 g/dL (ref 30.0–36.0)
MCV: 92.1 fL (ref 80.0–100.0)
Platelets: 365 10*3/uL (ref 150–400)
RBC: 4.05 MIL/uL — ABNORMAL LOW (ref 4.22–5.81)
RDW: 16.2 % — ABNORMAL HIGH (ref 11.5–15.5)
WBC: 7.2 10*3/uL (ref 4.0–10.5)
nRBC: 0 % (ref 0.0–0.2)

## 2022-09-14 LAB — IRON AND TIBC
Iron: 127 ug/dL (ref 45–182)
Saturation Ratios: 46 % — ABNORMAL HIGH (ref 17.9–39.5)
TIBC: 277 ug/dL (ref 250–450)
UIBC: 150 ug/dL

## 2022-09-14 LAB — FERRITIN: Ferritin: 258 ng/mL (ref 24–336)

## 2022-09-14 LAB — HEPARIN LEVEL (UNFRACTIONATED): Heparin Unfractionated: 0.44 IU/mL (ref 0.30–0.70)

## 2022-09-14 LAB — TSH: TSH: 4.436 u[IU]/mL (ref 0.350–4.500)

## 2022-09-14 MED ORDER — METOPROLOL SUCCINATE ER 25 MG PO TB24
25.0000 mg | ORAL_TABLET | Freq: Every day | ORAL | Status: DC
  Administered 2022-09-14 – 2022-09-15 (×2): 25 mg via ORAL
  Filled 2022-09-14 (×2): qty 1

## 2022-09-14 MED ORDER — SACUBITRIL-VALSARTAN 24-26 MG PO TABS
1.0000 | ORAL_TABLET | Freq: Two times a day (BID) | ORAL | Status: DC
  Administered 2022-09-14 – 2022-09-15 (×3): 1 via ORAL
  Filled 2022-09-14 (×3): qty 1

## 2022-09-14 MED ORDER — EMPAGLIFLOZIN 10 MG PO TABS
10.0000 mg | ORAL_TABLET | Freq: Every day | ORAL | Status: DC
  Administered 2022-09-14 – 2022-09-15 (×2): 10 mg via ORAL
  Filled 2022-09-14 (×2): qty 1

## 2022-09-14 MED ORDER — SPIRONOLACTONE 12.5 MG HALF TABLET
12.5000 mg | ORAL_TABLET | Freq: Every day | ORAL | Status: DC
  Administered 2022-09-14 – 2022-09-15 (×2): 12.5 mg via ORAL
  Filled 2022-09-14 (×2): qty 1

## 2022-09-14 NOTE — Hospital Course (Signed)
Albert King is a 62 yo male with PMH HTN, HLD, gout, and prostate cancer s/p TURP in 2021, vape use, alcohol use who presented with abdominal pain.  Location was endorsed to be in the epigastrium and left upper quadrant.  He had associated nausea/vomiting. He had not eaten anything prior to the onset of symptoms.  Reported associated symptoms are chills. Denies any recent fever, cough, palpitations, chest pain, diarrhea, or leg swelling.  He had been in his normal state of health, but did report a gout flare in his right foot last week that had since resolved. He receives most of his care at the [facility] where he had a TURP done for prostate cancer in 2021. Patient reports PSAs have been zero with last check 3 months ago.  Patient does admit to vaping and drinks half a gallon of rum and coke every 3 days, but denies any history of withdrawals. CT abdomen/pelvis along with CT angio chest were obtained.  There was some minimal calcified mural thrombus along the medial wall of the lower thoracic aorta consistent with a chronic process.  Enlarged pulmonary outflow tract/main pulmonary arteries noted suggesting underlying pulmonary arterial hypertension.  No PE. 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 Echo also obtained which showed EF 35 to 40%, global hypokinesis, grade 1 diastolic dysfunction, in coordinate septal motion.  Cardiology was consulted on admission. See below for further A&P.

## 2022-09-14 NOTE — Progress Notes (Signed)
ANTICOAGULATION CONSULT NOTE - Follow-up consult  Pharmacy Consult for heparin Indication: splenic infarcts  Allergies  Allergen Reactions   Allopurinol     Patient Measurements: Height: 5\' 11"  (180.3 cm) Weight: 86 kg (189 lb 11.2 oz) IBW/kg (Calculated) : 75.3 Heparin Dosing Weight: 86kg  Vital Signs: Temp: 98.3 F (36.8 C) (02/04 0500) Temp Source: Oral (02/04 0500) BP: 140/80 (02/04 0500) Pulse Rate: 75 (02/04 0500)  Labs: Recent Labs    09/12/22 2231 09/13/22 1229 09/13/22 1900 09/13/22 2012 09/13/22 2205 09/14/22 0254  HGB 13.4 13.0  --   --   --  12.1*  HCT 40.3 38.0*  --   --   --  37.3*  PLT 567* 468*  --   --   --  365  HEPARINUNFRC  --  0.16* 0.38  --   --  0.44  CREATININE 0.78 0.82  --   --   --  0.95  TROPONINIHS  --   --   --  127* 126*  --      Estimated Creatinine Clearance: 88.1 mL/min (by C-G formula based on SCr of 0.95 mg/dL).   Medical History: Past Medical History:  Diagnosis Date   Gout    Hypercholesteremia    Hypertension      Assessment:  36yoM presenting to the ED found to have splenic infarcts. No anticoagulation reported prior to admission. CBC WNL. Pharmacy consulted to dose heparin.   Heparin level therapeutic 2/4 AM at 0.44 with heparin running at 1,550 units/hour. No signs of bleeding or issues with infusion noted by RN. Hgb 12.1, PLT 365 (stable, slightly decreased).    Goal of Therapy:  Heparin level 0.3-0.7 units/ml Monitor platelets by anticoagulation protocol: Yes   Plan:  Continue heparin infusion at 1550 units/hr Monitor daily CBC, heparin level, s/sx bleeding F/U ability to start DOAC- will need to check co-pays    Adria Dill, PharmD PGY-2 Infectious Diseases Resident  09/14/2022 7:33 AM

## 2022-09-14 NOTE — Progress Notes (Signed)
Progress Note    Albert King   ZOX:[id number]  DOB: 03/22/1961  DOA: 09/13/2022     0 PCP: Pcp, No  Initial CC: abd pain, N/V  Hospital Course: Albert King is a 62 yo male with PMH HTN, HLD, gout, and prostate cancer s/p TURP in 2021, vape use, alcohol use who presented with abdominal pain.  Location was endorsed to be in the epigastrium and left upper quadrant.  Albert King had associated nausea/vomiting. Albert King had not eaten anything prior to the onset of symptoms.  Reported associated symptoms are chills. Denies any recent fever, cough, palpitations, chest pain, diarrhea, or leg swelling.  Albert King had been in his normal state of health, but did report a gout flare in his right foot last week that had since resolved. Albert King receives most of his care at the [facility] where Albert King had a TURP done for prostate cancer in 2021. Patient reports PSAs have been zero with last check 3 months ago.  Patient does admit to vaping and drinks half a gallon of rum and coke every 3 days, but denies any history of withdrawals. CT abdomen/pelvis along with CT angio chest were obtained.  There was some minimal calcified mural thrombus along the medial wall of the lower thoracic aorta consistent with a chronic process.  Enlarged pulmonary outflow tract/main pulmonary arteries noted suggesting underlying pulmonary arterial hypertension.  No PE. 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 Echo also obtained which showed EF 35 to 40%, global hypokinesis, grade 1 diastolic dysfunction, in coordinate septal motion.  Cardiology was consulted on admission.  Interval History:  No events overnight.  States his abdominal pain is improved today.  No overt bleeding.  Assessment and Plan:  Splenic infarcts -Suspect this may be contributing to the left-sided abdominal pain and associated symptoms on admission -Agree with pursuing cardiac monitor at discharge.  If unable to  set up [facility] to [facility] -Continue heparin drip through today and we will plan on starting Eliquis on Monday if no issues on heparin drip and hemoglobin remaining stable  Thoracic aortic mural thrombus -Per CTA chest, shows minimal calcified mural thrombus along the medial wall of the lower thoracic aorta consistent with chronic process - continue asa and statin  HFrEF - evaluated by cardiology inpatient and will continue care at [facility] at discharge -EF 35 to 40%, global hypokinesis, grade 1 DD - possible that etoh use contributing -Starting GDMT was recommended per cardiology.  Albert King has been started on Toprol, spironolactone, Jardiance, and Entresto - Outpatient further workup recommended with either CT coronary angio versus myocardial perfusion study with exercise.  [facility]  Leukocytosis Thrombocytosis Acute.  WBC 12.7 with platelet count 567.  Urinalysis did not show signs of infection. -Continue to monitor   Elevated liver function test Acute.  Labs from 2/2 alkaline phosphatase 138 and total bilirubin 2.4.  CT scan of the abdomen pelvis noted hepatic steatosis.  Albert King also reports drinking significant alcohol, but AST and ALT not elevated.     Prolonged QT interval Acute.  QTc elevated at 618. -Avoid QT prolonging medications -Correct electrolyte abnormalities   History of prostate cancer Patient is status post TURP.  Follows with urology at the [facility].  Last available PSA <0.01 from 12/28/2020 on care everywhere.  Patient reports last PSA check approximately 3 months ago was 0. -[facility]   Prediabetes On  admission glucose 143. -Patient requesting regular diet.   Abnormality of iliac bone Patient noted to have trabecular thickening and sclerosis involving the iliac bone on the left concerning for possibility of Paget's disease versus osteoblastic metastasis. -May warrant further workup  of findings at [facility]   Gout Patient reported having a flare last week of the right foot that has since resolved. -Continue febuxostat   Hyperlipidemia -Continue atorvastatin   Alcohol abuse Patient reports drinking half a gallon of rum and coke every 3 days.  Denies any history of alcohol withdrawals -CIWA protocols with Ativan IV as needed   Vape user Patient reports vaping.   -Counseled patient on need to quit vaping and the dangers of continuing.    Old records reviewed in assessment of this patient  Antimicrobials:   DVT prophylaxis:   heparin drip  Code Status:   Code Status: Full Code  Mobility Assessment (last 72 hours)     Mobility Assessment     Row Name 09/13/22 2039 09/13/22 0559 09/13/22 0550       Does patient have an order for bedrest or is patient medically unstable No - Continue assessment No - Continue assessment No - Continue assessment     What is the highest level of mobility based on the progressive mobility assessment? Level 6 (Walks independently in room and hall) - Balance while walking in room without assist - Complete Level 6 (Walks independently in room and hall) - Balance while walking in room without assist - Complete Level 6 (Walks independently in room and hall) - Balance while walking in room without assist - Complete              Barriers to discharge:  Disposition Plan:  Home Monday Status is: Inpt  Objective: Blood pressure (!) 140/80, pulse 75, temperature 98.3 F (36.8 C), temperature source Oral, resp. rate 16, height 5\' 11"  (1.803 m), weight 86 kg, SpO2 100 %.  Examination:  Physical Exam Constitutional:      General: Albert King is not in acute distress.    Appearance: Normal appearance.  HENT:     Head: Normocephalic and atraumatic.     Mouth/Throat:     Mouth: Mucous membranes are moist.  Eyes:     Extraocular Movements: Extraocular movements intact.  Cardiovascular:     Rate and Rhythm: Normal rate and regular rhythm.      Heart sounds: Normal heart sounds.  Pulmonary:     Effort: Pulmonary effort is normal. No respiratory distress.     Breath sounds: Normal breath sounds. No wheezing.  Abdominal:     General: Bowel sounds are normal. There is no distension.     Palpations: Abdomen is soft.     Tenderness: There is no abdominal tenderness.  Musculoskeletal:        General: Normal range of motion.     Cervical back: Normal range of motion and neck supple.  Skin:    General: Skin is warm and dry.  Neurological:     General: No focal deficit present.     Mental Status: Albert King is alert.  Psychiatric:        Mood and Affect: Mood normal.        Behavior: Behavior normal.      Consultants:  Cardiology  Procedures:    Data Reviewed: Results for orders placed or performed during the hospital encounter of 09/13/22 (from the past 24 hour(s))  Heparin level (unfractionated)     Status: Abnormal  Collection Time: 09/13/22 12:29 PM  Result Value Ref Range   Heparin Unfractionated 0.16 (L) 0.30 - 0.70 IU/mL  Sedimentation rate     Status: None   Collection Time: 09/13/22 12:29 PM  Result Value Ref Range   Sed Rate 11 0 - 16 mm/hr  HIV Antibody (routine testing w rflx)     Status: None   Collection Time: 09/13/22 12:29 PM  Result Value Ref Range   HIV Screen 4th Generation wRfx Non Reactive Non Reactive  Hemoglobin A1c     Status: None   Collection Time: 09/13/22 12:29 PM  Result Value Ref Range   Hgb A1c MFr Bld 4.9 4.8 - 5.6 %   Mean Plasma Glucose 93.93 mg/dL  Comprehensive metabolic panel     Status: Abnormal   Collection Time: 09/13/22 12:29 PM  Result Value Ref Range   Sodium 134 (L) 135 - 145 mmol/L   Potassium 2.9 (L) 3.5 - 5.1 mmol/L   Chloride 102 98 - 111 mmol/L   CO2 21 (L) 22 - 32 mmol/L   Glucose, Bld 103 (H) 70 - 99 mg/dL   BUN 6 6 - 20 mg/dL   Creatinine, Ser 0.82 0.61 - 1.24 mg/dL   Calcium 8.5 (L) 8.9 - 10.3 mg/dL   Total Protein 6.2 (L) 6.5 - 8.1 g/dL   Albumin 3.0 (L) 3.5 -  5.0 g/dL   AST 34 15 - 41 U/L   ALT 20 0 - 44 U/L   Alkaline Phosphatase 143 (H) 38 - 126 U/L   Total Bilirubin 2.2 (H) 0.3 - 1.2 mg/dL   GFR, Estimated >60 >60 mL/min   Anion gap 11 5 - 15  CBC with Differential/Platelet     Status: Abnormal   Collection Time: 09/13/22 12:29 PM  Result Value Ref Range   WBC 9.5 4.0 - 10.5 K/uL   RBC 4.28 4.22 - 5.81 MIL/uL   Hemoglobin 13.0 13.0 - 17.0 g/dL   HCT 38.0 (L) 39.0 - 52.0 %   MCV 88.8 80.0 - 100.0 fL   MCH 30.4 26.0 - 34.0 pg   MCHC 34.2 30.0 - 36.0 g/dL   RDW 16.5 (H) 11.5 - 15.5 %   Platelets 468 (H) 150 - 400 K/uL   nRBC 0.0 0.0 - 0.2 %   Neutrophils Relative % 66 %   Neutro Abs 6.3 1.7 - 7.7 K/uL   Lymphocytes Relative 22 %   Lymphs Abs 2.1 0.7 - 4.0 K/uL   Monocytes Relative 10 %   Monocytes Absolute 1.0 0.1 - 1.0 K/uL   Eosinophils Relative 1 %   Eosinophils Absolute 0.1 0.0 - 0.5 K/uL   Basophils Relative 0 %   Basophils Absolute 0.0 0.0 - 0.1 K/uL   Immature Granulocytes 1 %   Abs Immature Granulocytes 0.05 0.00 - 0.07 K/uL  Magnesium     Status: None   Collection Time: 09/13/22 12:29 PM  Result Value Ref Range   Magnesium 1.7 1.7 - 2.4 mg/dL  Heparin level (unfractionated)     Status: None   Collection Time: 09/13/22  7:00 PM  Result Value Ref Range   Heparin Unfractionated 0.38 0.30 - 0.70 IU/mL  Troponin I (High Sensitivity)     Status: Abnormal   Collection Time: 09/13/22  8:12 PM  Result Value Ref Range   Troponin I (High Sensitivity) 127 (HH) <18 ng/L  Troponin I (High Sensitivity)     Status: Abnormal   Collection Time: 09/13/22 10:05 PM  Result  Value Ref Range   Troponin I (High Sensitivity) 126 (HH) <18 ng/L  Heparin level (unfractionated)     Status: None   Collection Time: 09/14/22  2:54 AM  Result Value Ref Range   Heparin Unfractionated 0.44 0.30 - 0.70 IU/mL  CBC     Status: Abnormal   Collection Time: 09/14/22  2:54 AM  Result Value Ref Range   WBC 7.2 4.0 - 10.5 K/uL   RBC 4.05 (L) 4.22 -  5.81 MIL/uL   Hemoglobin 12.1 (L) 13.0 - 17.0 g/dL   HCT 37.3 (L) 39.0 - 52.0 %   MCV 92.1 80.0 - 100.0 fL   MCH 29.9 26.0 - 34.0 pg   MCHC 32.4 30.0 - 36.0 g/dL   RDW 16.2 (H) 11.5 - 15.5 %   Platelets 365 150 - 400 K/uL   nRBC 0.0 0.0 - 0.2 %  Comprehensive metabolic panel     Status: Abnormal   Collection Time: 09/14/22  2:54 AM  Result Value Ref Range   Sodium 137 135 - 145 mmol/L   Potassium 3.7 3.5 - 5.1 mmol/L   Chloride 106 98 - 111 mmol/L   CO2 23 22 - 32 mmol/L   Glucose, Bld 83 70 - 99 mg/dL   BUN <5 (L) 6 - 20 mg/dL   Creatinine, Ser 0.95 0.61 - 1.24 mg/dL   Calcium 8.4 (L) 8.9 - 10.3 mg/dL   Total Protein 5.5 (L) 6.5 - 8.1 g/dL   Albumin 2.6 (L) 3.5 - 5.0 g/dL   AST 28 15 - 41 U/L   ALT 18 0 - 44 U/L   Alkaline Phosphatase 126 38 - 126 U/L   Total Bilirubin 1.9 (H) 0.3 - 1.2 mg/dL   GFR, Estimated >60 >60 mL/min   Anion gap 8 5 - 15  Ferritin     Status: None   Collection Time: 09/14/22  2:54 AM  Result Value Ref Range   Ferritin 258 24 - 336 ng/mL  Iron and TIBC     Status: Abnormal   Collection Time: 09/14/22  2:54 AM  Result Value Ref Range   Iron 127 45 - 182 ug/dL   TIBC 277 250 - 450 ug/dL   Saturation Ratios 46 (H) 17.9 - 39.5 %   UIBC 150 ug/dL  TSH     Status: None   Collection Time: 09/14/22  6:59 AM  Result Value Ref Range   TSH 4.436 0.350 - 4.500 uIU/mL    I have reviewed pertinent nursing notes, vitals, labs, and images as necessary. I have ordered labwork to follow up on as indicated.  I have reviewed the last notes from staff over past 24 hours. I have discussed patient's care plan and test results with nursing staff, CM/SW, and other staff as appropriate.  Time spent: Greater than 50% of the 55 minute visit was spent in counseling/coordination of care for the patient as laid out in the A&P.   LOS: 0 days   Dwyane Dee, MD [facility] 09/14/2022, 12:13 PM

## 2022-09-14 NOTE — Progress Notes (Signed)
Rounding Note    Patient Name: Albert King Date of Encounter: 09/14/2022  [facility] Cardiologist: None new  Subjective   Denies chest pain and sob. "My stomach feels better."  Inpatient Medications    Scheduled Meds:  aspirin EC  81 mg Oral Daily   atorvastatin  40 mg Oral Daily   empagliflozin  10 mg Oral Daily   febuxostat  40 mg Oral Daily   folic acid  1 mg Oral Daily   metoprolol succinate  25 mg Oral Daily   multivitamin with minerals  1 tablet Oral Daily   sacubitril-valsartan  1 tablet Oral BID   sodium chloride flush  3 mL Intravenous Q12H   spironolactone  12.5 mg Oral Daily   thiamine  100 mg Oral Daily   Or   thiamine  100 mg Intravenous Daily   Continuous Infusions:  heparin 1,550 Units/hr (09/14/22 0658)   PRN Meds: acetaminophen **OR** acetaminophen, albuterol, fentaNYL (SUBLIMAZE) injection, hydrALAZINE, HYDROcodone-acetaminophen, LORazepam **OR** LORazepam, trimethobenzamide   Vital Signs    Vitals:   09/13/22 1535 09/13/22 2208 09/14/22 0033 09/14/22 0500  BP: (!) 141/75 (!) 148/82 138/85 (!) 140/80  Pulse: (!) 59 (!) 55 72 75  Resp: 17 18 18 16   Temp: 98.5 F (36.9 C) 98.1 F (36.7 C) 97.8 F (36.6 C) 98.3 F (36.8 C)  TempSrc: Oral Oral Oral Oral  SpO2: 100% 100% 100% 100%  Weight:      Height:        Intake/Output Summary (Last 24 hours) at 09/14/2022 1144 Last data filed at 09/14/2022 1100 Gross per 24 hour  Intake 1092.03 ml  Output --  Net 1092.03 ml      09/13/2022    4:15 AM 07/21/2020    8:52 AM 03/15/2015    8:49 PM  Last 3 Weights  Weight (lbs) 189 lb 11.2 oz 225 lb 220 lb  Weight (kg) 86.047 kg 102.059 kg 99.791 kg      Telemetry    nsr - Personally Reviewed  ECG    none - Personally Reviewed  Physical Exam   GEN: well appearing, No acute distress.   Neck: No JVD Cardiac: RRR, no murmurs, rubs, or gallops.  Respiratory: Clear to auscultation bilaterally. GI: Soft, nontender, non-distended  MS:  No edema; No deformity. Neuro:  Nonfocal  Psych: Normal affect   Labs    High Sensitivity Troponin:   Recent Labs  Lab 09/13/22 2012 09/13/22 2205  TROPONINIHS 127* 126*     Chemistry Recent Labs  Lab 09/12/22 2231 09/13/22 1229 09/14/22 0254  NA 136 134* 137  K 3.7 2.9* 3.7  CL 101 102 106  CO2 23 21* 23  GLUCOSE 143* 103* 83  BUN 9 6 <5*  CREATININE 0.78 0.82 0.95  CALCIUM 8.9 8.5* 8.4*  MG  --  1.7  --   PROT 6.8 6.2* 5.5*  ALBUMIN 3.5 3.0* 2.6*  AST 28 34 28  ALT 20 20 18   ALKPHOS 138* 143* 126  BILITOT 2.4* 2.2* 1.9*  GFRNONAA >60 >60 >60  ANIONGAP 12 11 8     Lipids No results for input(s): "CHOL", "TRIG", "HDL", "LABVLDL", "LDLCALC", "CHOLHDL" in the last 168 hours.  Hematology Recent Labs  Lab 09/12/22 2231 09/13/22 1229 09/14/22 0254  WBC 12.7* 9.5 7.2  RBC 4.58 4.28 4.05*  HGB 13.4 13.0 12.1*  HCT 40.3 38.0* 37.3*  MCV 88.0 88.8 92.1  MCH 29.3 30.4 29.9  MCHC 33.3 34.2 32.4  RDW  16.5* 16.5* 16.2*  PLT 567* 468* 365   Thyroid  Recent Labs  Lab 09/14/22 0659  TSH 4.436    BNPNo results for input(s): "BNP", "PROBNP" in the last 168 hours.  DDimer  Recent Labs  Lab 09/13/22 0318  DDIMER 0.71*     Radiology    ECHOCARDIOGRAM COMPLETE  Result Date: 09/13/2022    ECHOCARDIOGRAM REPORT   Patient Name:   Albert King Date of Exam: 09/13/2022 Medical Rec #:  [id number]      Height:       71.0 in Accession #:    [id number]     Weight:       189.7 lb Date of Birth:  April 20, 1961       BSA:          2.062 m Patient Age:    61 years       BP:           133/76 mmHg Patient Gender: M              HR:           55 bpm. Exam Location:  Inpatient Procedure: 2D Echo, Cardiac Doppler and Color Doppler Indications:    Abnormal ECG                 Splenic infarct  History:        Patient has no prior history of Echocardiogram examinations.                 Risk Factors:Hypertension and Dyslipidemia.  Sonographer:    Ross Ludwig RDCS (AE) Referring Phys: Arn Medal A  SMITH IMPRESSIONS  1. Left ventricular ejection fraction, by estimation, is 35 to 40%. The left ventricle has moderately decreased function. The left ventricle demonstrates global hypokinesis. Left ventricular diastolic parameters are consistent with Grade I diastolic dysfunction (impaired relaxation). There is incoordinate septal motion.  2. Right ventricular systolic function is normal. The right ventricular size is normal. Tricuspid regurgitation signal is inadequate for assessing PA pressure.  3. The mitral valve is abnormal. Trivial mitral valve regurgitation.  4. The aortic valve is tricuspid. Aortic valve regurgitation is not visualized.  5. The inferior vena cava is dilated in size with >50% respiratory variability, suggesting right atrial pressure of 8 mmHg. Comparison(s): No prior Echocardiogram. FINDINGS  Left Ventricle: Left ventricular ejection fraction, by estimation, is 35 to 40%. The left ventricle has moderately decreased function. The left ventricle demonstrates global hypokinesis. The left ventricular internal cavity size was normal in size. There is no left ventricular hypertrophy. Incoordinate septal motion. Left ventricular diastolic parameters are consistent with Grade I diastolic dysfunction (impaired relaxation). Indeterminate filling pressures. Right Ventricle: The right ventricular size is normal. No increase in right ventricular wall thickness. Right ventricular systolic function is normal. Tricuspid regurgitation signal is inadequate for assessing PA pressure. Left Atrium: Left atrial size was normal in size. Right Atrium: Right atrial size was normal in size. Pericardium: There is no evidence of pericardial effusion. Mitral Valve: The mitral valve is abnormal. There is mild thickening of the anterior and posterior mitral valve leaflet(s). Trivial mitral valve regurgitation. Tricuspid Valve: The tricuspid valve is grossly normal. Tricuspid valve regurgitation is not demonstrated. Aortic  Valve: The aortic valve is tricuspid. Aortic valve regurgitation is not visualized. Aortic valve mean gradient measures 3.0 mmHg. Aortic valve peak gradient measures 5.6 mmHg. Aortic valve area, by VTI measures 2.96 cm. Pulmonic Valve: The pulmonic valve was normal in structure. Pulmonic valve  regurgitation is not visualized. Aorta: The aortic root and ascending aorta are structurally normal, with no evidence of dilitation. Venous: The inferior vena cava is dilated in size with greater than 50% respiratory variability, suggesting right atrial pressure of 8 mmHg. IAS/Shunts: No atrial level shunt detected by color flow Doppler.  LEFT VENTRICLE PLAX 2D LVIDd:         5.20 cm     Diastology LVIDs:         3.30 cm     LV e' medial:    6.85 cm/s LV PW:         1.00 cm     LV E/e' medial:  8.7 LV IVS:        1.00 cm     LV e' lateral:   5.00 cm/s LVOT diam:     2.40 cm     LV E/e' lateral: 11.9 LV SV:         74 LV SV Index:   36 LVOT Area:     4.52 cm  LV Volumes (MOD) LV vol d, MOD A2C: 88.2 ml LV vol d, MOD A4C: 90.3 ml LV vol s, MOD A2C: 53.6 ml LV vol s, MOD A4C: 64.0 ml LV SV MOD A2C:     34.6 ml LV SV MOD A4C:     90.3 ml LV SV MOD BP:      30.1 ml RIGHT VENTRICLE          IVC RV Basal diam:  2.60 cm  IVC diam: 2.20 cm TAPSE (M-mode): 2.2 cm LEFT ATRIUM           Index        RIGHT ATRIUM           Index LA diam:      3.60 cm 1.75 cm/m   RA Area:     15.20 cm LA Vol (A2C): 36.8 ml 17.85 ml/m  RA Volume:   39.10 ml  18.96 ml/m LA Vol (A4C): 58.9 ml 28.57 ml/m  AORTIC VALVE AV Area (Vmax):    3.24 cm AV Area (Vmean):   3.04 cm AV Area (VTI):     2.96 cm AV Vmax:           118.00 cm/s AV Vmean:          75.500 cm/s AV VTI:            0.251 m AV Peak Grad:      5.6 mmHg AV Mean Grad:      3.0 mmHg LVOT Vmax:         84.40 cm/s LVOT Vmean:        50.800 cm/s LVOT VTI:          0.164 m LVOT/AV VTI ratio: 0.65  AORTA Ao Root diam: 3.30 cm Ao Asc diam:  3.30 cm MITRAL VALVE MV Area (PHT): 2.86 cm    SHUNTS MV  Decel Time: 265 msec    Systemic VTI:  0.16 m MV E velocity: 59.70 cm/s  Systemic Diam: 2.40 cm MV A velocity: 61.80 cm/s MV E/A ratio:  0.97 Zoila Shutter MD Electronically signed by Zoila Shutter MD Signature Date/Time: 09/13/2022/3:27:57 PM    Final    CT Angio Chest Aorta W and/or Wo Contrast  Result Date: 09/13/2022 CLINICAL DATA:  Acute aortic syndrome suspected. Splenic infarct on abdominal CT earlier same day EXAM: CT ANGIOGRAPHY CHEST WITH CONTRAST TECHNIQUE: Multidetector CT imaging of the chest was performed using the standard protocol  during bolus administration of intravenous contrast. Multiplanar CT image reconstructions and MIPs were obtained to evaluate the vascular anatomy. RADIATION DOSE REDUCTION: This exam was performed according to the departmental dose-optimization program which includes automated exposure control, adjustment of the mA and/or kV according to patient size and/or use of iterative reconstruction technique. CONTRAST:  131mL OMNIPAQUE IOHEXOL 350 MG/ML SOLN COMPARISON:  CT abdomen/pelvis from earlier same day FINDINGS: Cardiovascular: Ascending thoracic aorta measures 3.3 cm diameter. Descending thoracic aorta is 2.6 cm diameter. Mild atherosclerotic calcification is noted in the wall of the thoracic aorta. No evidence for dissection flap within the thoracic aorta. Aortic arch vessel branch anatomy is widely patent. There is some minimal calcified mural thrombus along the medial wall of the lower thoracic aorta consistent with chronic process (axial 108/4). Enlargement of the pulmonary outflow tract/main pulmonary arteries suggests pulmonary arterial hypertension. No evidence for central pulmonary embolus in the main pulmonary arteries, lobar pulmonary arteries, or segmental pulmonary arteries. Imaging on the study was carried down into the mid abdomen and the celiac axis and SMA are widely patent. Splenic artery appears widely patent without appreciable atherosclerotic disease,  filling defect or aneurysm. Mediastinum/Nodes: No mediastinal lymphadenopathy. There is no hilar lymphadenopathy. The esophagus has normal imaging features. There is no axillary lymphadenopathy. Lungs/Pleura: No focal airspace consolidation. No suspicious pulmonary nodule or mass. No pulmonary edema or pleural effusion. Upper Abdomen: Deferred to dedicated abdomen and pelvis CT exam performed immediately prior to this study. Musculoskeletal: No worrisome lytic or sclerotic osseous abnormality. Review of the MIP images confirms the above findings. IMPRESSION: 1. No evidence for thoracic aortic aneurysm or dissection. There is some minimal calcified mural thrombus along the medial wall of the lower thoracic aorta consistent with chronic process. 2. Enlargement of the pulmonary outflow tract/main pulmonary arteries suggests pulmonary arterial hypertension. 3. No evidence for central pulmonary embolus. Electronically Signed   By: Misty Stanley M.D.   On: 09/13/2022 05:18   CT Abdomen Pelvis W Contrast  Result Date: 09/13/2022 CLINICAL DATA:  Left lower quadrant abdominal pain. EXAM: CT ABDOMEN AND PELVIS WITH CONTRAST TECHNIQUE: Multidetector CT imaging of the abdomen and pelvis was performed using the standard protocol following bolus administration of intravenous contrast. RADIATION DOSE REDUCTION: This exam was performed according to the departmental dose-optimization program which includes automated exposure control, adjustment of the mA and/or kV according to patient size and/or use of iterative reconstruction technique. CONTRAST:  140mL OMNIPAQUE IOHEXOL 300 MG/ML  SOLN COMPARISON:  06/05/2009. FINDINGS: Lower chest: No acute abnormality. Hepatobiliary: No focal liver abnormality is seen. Fatty infiltration of the liver is noted. No gallstones, gallbladder wall thickening, or biliary dilatation. Pancreas: Unremarkable. No pancreatic ductal dilatation or surrounding inflammatory changes. Spleen: Multifocal  hypodense regions are present in the spleen, suspicious for infarcts. No subcapsular hematoma is seen. Adrenals/Urinary Tract: The adrenal glands are within normal limits. Kidneys enhance symmetrically. No renal calculus or hydronephrosis. Bladder is unremarkable. Stomach/Bowel: Stomach is within normal limits. Appendix appears normal. No evidence of bowel wall thickening, distention, or inflammatory changes. No free air or pneumatosis. Vascular/Lymphatic: Aortic atherosclerosis. No enlarged abdominal or pelvic lymph nodes. Reproductive: The prostate gland is not seen. Other: No abdominopelvic ascites. Musculoskeletal: Trabecular thickening and sclerosis are noted in the iliac bone on the left, suggesting Paget's disease. Mild degenerative changes in the thoracolumbar spine. No acute osseous abnormality IMPRESSION: 1. Regional hypodensities in the spleen, suggesting infarcts and indeterminate in age. 2. Hepatic steatosis. 3. 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 Electronically Signed   By: Brett Fairy M.D.   On: 09/13/2022 02:30    Cardiac Studies   2D echo noted  Patient Profile     62 y.o. male admitted with abdominal pain and found to have unrelated LV dysfunction  Assessment & Plan    Abdominal pain - as per primary team.  LV dysfunction - he denies chest pain or sob. His abdominal pain is not angina. He has a [place] doctor he is supposed to see on Tuesday. Ok to DC when ok from abdominal perspective. Continue beta blocker and entresto. No additional rec's.  [place] will sign off.   Medication Recommendations:  see above Other recommendations (labs, testing, etc):  none Follow up as an outpatient:  Dr. At [facility] will see later this week according to the patient.  For questions or updates, please contact [place] Please consult [email or web address] for contact info under    Signed, Cristopher Peru, MD  09/14/2022, 11:44 AM

## 2022-09-15 ENCOUNTER — Other Ambulatory Visit (HOSPITAL_COMMUNITY): Payer: Self-pay

## 2022-09-15 ENCOUNTER — Telehealth (HOSPITAL_COMMUNITY): Payer: Self-pay

## 2022-09-15 DIAGNOSIS — I5021 Acute systolic (congestive) heart failure: Secondary | ICD-10-CM | POA: Diagnosis not present

## 2022-09-15 DIAGNOSIS — D735 Infarction of spleen: Secondary | ICD-10-CM | POA: Diagnosis not present

## 2022-09-15 LAB — CBC
HCT: 34.4 % — ABNORMAL LOW (ref 39.0–52.0)
Hemoglobin: 11.3 g/dL — ABNORMAL LOW (ref 13.0–17.0)
MCH: 30 pg (ref 26.0–34.0)
MCHC: 32.8 g/dL (ref 30.0–36.0)
MCV: 91.2 fL (ref 80.0–100.0)
Platelets: 344 10*3/uL (ref 150–400)
RBC: 3.77 MIL/uL — ABNORMAL LOW (ref 4.22–5.81)
RDW: 16 % — ABNORMAL HIGH (ref 11.5–15.5)
WBC: 7.9 10*3/uL (ref 4.0–10.5)
nRBC: 0 % (ref 0.0–0.2)

## 2022-09-15 LAB — BASIC METABOLIC PANEL
Anion gap: 12 (ref 5–15)
BUN: 5 mg/dL — ABNORMAL LOW (ref 6–20)
CO2: 23 mmol/L (ref 22–32)
Calcium: 8.2 mg/dL — ABNORMAL LOW (ref 8.9–10.3)
Chloride: 100 mmol/L (ref 98–111)
Creatinine, Ser: 1.02 mg/dL (ref 0.61–1.24)
GFR, Estimated: >60 mL/min (ref >60)
Glucose, Bld: 97 mg/dL (ref 70–99)
Potassium: 3 mmol/L — ABNORMAL LOW (ref 3.5–5.1)
Sodium: 135 mmol/L (ref 135–145)

## 2022-09-15 LAB — HEPARIN LEVEL (UNFRACTIONATED): Heparin Unfractionated: 0.32 IU/mL (ref 0.30–0.70)

## 2022-09-15 LAB — MAGNESIUM: Magnesium: 1.7 mg/dL (ref 1.7–2.4)

## 2022-09-15 MED ORDER — APIXABAN 5 MG PO TABS
5.0000 mg | ORAL_TABLET | Freq: Two times a day (BID) | ORAL | Status: DC
  Administered 2022-09-15: 5 mg via ORAL
  Filled 2022-09-15: qty 1

## 2022-09-15 MED ORDER — POTASSIUM CHLORIDE CRYS ER 20 MEQ PO TBCR
40.0000 meq | EXTENDED_RELEASE_TABLET | Freq: Once | ORAL | Status: AC
  Administered 2022-09-15: 40 meq via ORAL
  Filled 2022-09-15: qty 2

## 2022-09-15 MED ORDER — EMPAGLIFLOZIN 10 MG PO TABS
10.0000 mg | ORAL_TABLET | Freq: Every day | ORAL | 3 refills | Status: AC
  Filled 2022-09-15: qty 30, 30d supply, fill #0

## 2022-09-15 MED ORDER — ASPIRIN 81 MG PO TBEC: 81.0000 mg | DELAYED_RELEASE_TABLET | Freq: Every day | ORAL | 12 refills | Status: AC

## 2022-09-15 MED ORDER — APIXABAN 5 MG PO TABS
5.0000 mg | ORAL_TABLET | Freq: Two times a day (BID) | ORAL | 3 refills | Status: AC
  Filled 2022-09-15: qty 60, 30d supply, fill #0

## 2022-09-15 MED ORDER — METOPROLOL SUCCINATE ER 25 MG PO TB24
25.0000 mg | ORAL_TABLET | Freq: Every day | ORAL | 3 refills | Status: AC
  Filled 2022-09-15: qty 30, 30d supply, fill #0

## 2022-09-15 MED ORDER — MAGNESIUM SULFATE 2 GM/50ML IV SOLN
2.0000 g | Freq: Once | INTRAVENOUS | Status: AC
  Administered 2022-09-15: 2 g via INTRAVENOUS
  Filled 2022-09-15: qty 50

## 2022-09-15 MED ORDER — SPIRONOLACTONE 25 MG PO TABS
12.5000 mg | ORAL_TABLET | Freq: Every day | ORAL | 3 refills | Status: AC
  Filled 2022-09-15: qty 30, 60d supply, fill #0

## 2022-09-15 MED ORDER — SACUBITRIL-VALSARTAN 24-26 MG PO TABS
1.0000 | ORAL_TABLET | Freq: Two times a day (BID) | ORAL | 3 refills | Status: AC
  Filled 2022-09-15: qty 60, 30d supply, fill #0

## 2022-09-15 NOTE — Progress Notes (Signed)
Discharge instructions given to pt per flex nurse. Medications delivered to pts room. Pt discharged to home with wife via wheelchair by volunteer staff with all belongings.

## 2022-09-15 NOTE — TOC Benefit Eligibility Note (Signed)
Patient Teacher, English as a foreign language completed.    The patient is currently admitted and upon discharge could be taking Eliquis.  The current 30 day co-pay is $43.00   The patient is currently admitted and upon discharge could be taking Jardiance.  The current 30 day co-pay is $43.00   The patient is currently admitted and upon discharge could be taking Entresto.  The current 30 day co-pay is $43.00   The patient is currently admitted and upon discharge could be taking [place].  The current 30 day co-pay is [place] REQUIRED  The patient is insured through [employer]

## 2022-09-15 NOTE — Progress Notes (Signed)
   09/15/22 1000  Mobility  Activity Ambulated with assistance in hallway  Level of Assistance Modified independent, requires aide device or extra time  Assistive Device  (Iv Pole)  Distance Ambulated (ft) 500 ft  Activity Response Tolerated well  Mobility Referral Yes  $Mobility charge 1 Mobility   Mobility Specialist Progress Note  Received pt in bed having no complaints and agreeable to mobility. Pt was asymptomatic throughout ambulation and returned to room w/o fault. Left EOB w/ call bell in reach and all needs met.  Lucious Groves Mobility Specialist  Please contact via SecureChat or Rehab office at [phone]

## 2022-09-15 NOTE — Progress Notes (Signed)
Heart Failure Navigator Progress Note  Assessed for Heart & Vascular TOC clinic readiness.  Patient has a newly reduced EF of 35-40%, per Cardiology note they will sign off and patient has a follow up this week at the [place] on Tuesday. .   Navigator available for reassessment of patient.   Earnestine Leys, BSN, Clinical cytogeneticist Only

## 2022-09-15 NOTE — Telephone Encounter (Signed)
Pharmacy Patient Advocate Encounter  Insurance verification completed.    The patient is insured through [employer]   The patient is currently admitted and ran test claims for the following: Eliquis, Jardiance, Farxiga, [place].  Copays and coinsurance results were relayed to Inpatient clinical team.

## 2022-09-15 NOTE — Progress Notes (Signed)
ANTICOAGULATION CONSULT NOTE  Pharmacy Consult: Heparin >> Eliquis Indication: splenic infarcts and mural thrombus  Allergies  Allergen Reactions   Allopurinol     Patient Measurements: Height: 5\' 11"  (180.3 cm) Weight: 86 kg (189 lb 11.2 oz) IBW/kg (Calculated) : 75.3 Heparin Dosing Weight: 86kg  Vital Signs: Temp: 99.2 F (37.3 C) (02/05 0732) Temp Source: Oral (02/05 0732) BP: 126/86 (02/05 0732) Pulse Rate: 75 (02/05 0732)  Labs: Recent Labs    09/13/22 1229 09/13/22 1900 09/13/22 2012 09/13/22 2205 09/14/22 0254 09/15/22 0211  HGB 13.0  --   --   --  12.1* 11.3*  HCT 38.0*  --   --   --  37.3* 34.4*  PLT 468*  --   --   --  365 344  HEPARINUNFRC 0.16* 0.38  --   --  0.44 0.32  CREATININE 0.82  --   --   --  0.95 1.02  TROPONINIHS  --   --  127* 126*  --   --      Estimated Creatinine Clearance: 82 mL/min (by C-G formula based on SCr of 1.02 mg/dL).   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  Patient was started on IV heparin and Pharmacy now consulted to transition to Eliquis.  CBC stable; no bleeding reported.  Discussed with MD, since clots are not acute, will not load Eliquis.  Goal of Therapy:  Appropriate anticoagulation Monitor platelets by anticoagulation protocol: Yes   Plan:  Stop IV heparin when first dose of Eliquis is administered Eliquis 5mg  PO BID Pharmacy will sign off and monitor peripherally.  Thank you for the consult!  Destiney Sanabia D. Mina Marble, PharmD, BCPS, [place] 09/15/2022, 8:22 AM

## 2022-09-15 NOTE — Discharge Instructions (Addendum)
Follow up with the [facility]. Have blood work checked within 1-2 weeks.  Check blood pressure at home. If less than 90/60, hold new blood pressure meds  If feeling weak, dizzy, tired this may be a sign of bleeding and you need an immediate check of hemoglobin and evaluation.   Information on my medicine - ELIQUIS (apixaban)  This medication education was reviewed with me or my healthcare representative as part of my discharge preparation.    Why was Eliquis prescribed for you? Eliquis was prescribed to treat blood clots that may have been found in the veins of your legs (deep vein thrombosis) or in your lungs (pulmonary embolism) and to reduce the risk of them occurring again.  What do You need to know about Eliquis ? The dose is ONE 5 mg tablet taken TWICE daily.  Eliquis may be taken with or without food.   Try to take the dose about the same time in the morning and in the evening. If you have difficulty swallowing the tablet whole please discuss with your pharmacist how to take the medication safely.  Take Eliquis exactly as prescribed and DO NOT stop taking Eliquis without talking to the doctor who prescribed the medication.  Stopping may increase your risk of developing a new blood clot.  Refill your prescription before you run out.  After discharge, you should have regular check-up appointments with your healthcare provider that is prescribing your Eliquis.    What do you do if you miss a dose? If a dose of ELIQUIS is not taken at the scheduled time, take it as soon as possible on the same day and twice-daily administration should be resumed. The dose should not be doubled to make up for a missed dose.  Important Safety Information A possible side effect of Eliquis is bleeding. You should call your healthcare provider right away if you experience any of the following: Bleeding from an injury or your nose that does not stop. Unusual colored urine (red or dark brown) or unusual  colored stools (red or black). Unusual bruising for unknown reasons. A serious fall or if you hit your head (even if there is no bleeding).  Some medicines may interact with Eliquis and might increase your risk of bleeding or clotting while on Eliquis. To help avoid this, consult your healthcare provider or pharmacist prior to using any new prescription or non-prescription medications, including herbals, vitamins, non-steroidal anti-inflammatory drugs (NSAIDs) and supplements.  This website has more information on Eliquis (apixaban): http://www.eliquis.com/eliquis/home

## 2022-09-15 NOTE — Discharge Summary (Signed)
Physician Discharge Summary   Albert King LTJ:[id number] DOB: 10-28-1960 DOA: 09/13/2022  PCP: Oneita Hurt, No  Admit date: 09/13/2022 Discharge date: 09/15/2022   Admitted From: Home Disposition:  Home Discharging physician: Lewie Chamber, MD Barriers to discharge: n/a  Recommendations for Outpatient Follow-up:  [facility] Adjust BP meds further as necessary Adjust KCL supplement as necessary Check Hgb if any concern for bleeding  Outpatient further workup recommended with either CT coronary angio versus myocardial perfusion study with exercise. Needs referral to cardiology   Home Health: n/a Equipment/Devices: n/a  Discharge Condition: stable CODE STATUS: Full Diet recommendation:  Diet Orders (From admission, onward)     Start     Ordered   09/15/22 0000  Diet general        09/15/22 0835   09/13/22 1051  Diet regular Room service appropriate? Yes; Fluid consistency: Thin  Diet effective now       Question Answer Comment  Room service appropriate? Yes   Fluid consistency: Thin      09/13/22 1050            Hospital Course: Mr. Hunnicutt is a 62 yo male with PMH HTN, HLD, gout, and prostate cancer s/p TURP in 2021, vape use, alcohol use who presented with abdominal pain.  Location was endorsed to be in the epigastrium and left upper quadrant.  He had associated nausea/vomiting. He had not eaten anything prior to the onset of symptoms.  Reported associated symptoms are chills. Denies any recent fever, cough, palpitations, chest pain, diarrhea, or leg swelling.  He had been in his normal state of health, but did report a gout flare in his right foot last week that had since resolved. He receives most of his care at the [facility] where he had a TURP done for prostate cancer in 2021. Patient reports PSAs have been zero with last check 3 months ago.  Patient does admit to vaping and drinks half a gallon of rum and coke every 3 days, but denies any history of withdrawals. CT  abdomen/pelvis along with CT angio chest were obtained.  There was some minimal calcified mural thrombus along the medial wall of the lower thoracic aorta consistent with a chronic process.  Enlarged pulmonary outflow tract/main pulmonary arteries noted suggesting underlying pulmonary arterial hypertension.  No PE. 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 Echo also obtained which showed EF 35 to 40%, global hypokinesis, grade 1 diastolic dysfunction, in coordinate septal motion.  Cardiology was consulted on admission. See below for further A&P.   Assessment and Plan:  Splenic infarcts -Suspect this may be contributing to the left-sided abdominal pain and associated symptoms on admission; pain improved prior to discharge -Tolerated heparin drip with transition to Eliquis.  Mild hemoglobin drop but no overt concern for underlying bleeding.  Informed him of signs/symptoms to watch for in case of internal bleeding from splenic infarcts in setting of anticoagulation; wife also present for conversation - Continue Eliquis at discharge - Follow-up with [facility]   Thoracic aortic mural thrombus -Per CTA chest, shows minimal calcified mural thrombus along the medial wall of the lower thoracic aorta consistent with chronic process - continue asa and statin   HFrEF - evaluated by cardiology inpatient and will continue care at [facility] at discharge -EF 35 to 40%, global hypokinesis, grade 1 DD - possible that etoh use contributing -Starting GDMT was recommended per cardiology.  He has been started on Toprol, spironolactone,  [place] Pepper and Entresto - Outpatient further workup recommended with either CT coronary angio versus myocardial perfusion study with exercise.  [facility]   Leukocytosis - resolved  Thrombocytosis - resolved  Acute.  WBC 12.7 with platelet count 567.  Urinalysis did not show signs of  infection. -Continue to monitor   Elevated liver function test - resolved  Acute.  Labs from 2/2 alkaline phosphatase 138 and total bilirubin 2.4.  CT scan of the abdomen pelvis noted hepatic steatosis.  He also reports drinking significant alcohol, but AST and ALT not elevated.     Prolonged QT interval Acute.  QTc elevated at 618. -Avoid QT prolonging medications -Correct electrolyte abnormalities   History of prostate cancer Patient is status post TURP.  Follows with urology at the [facility].  Last available PSA <0.01 from 12/28/2020 on care everywhere.  Patient reports last PSA check approximately 3 months ago was 0. -[facility]   Hyperglycemia -A1c 4.9% on 09/13/2022   Abnormality of iliac bone Patient noted to have trabecular thickening and sclerosis involving the iliac bone on the left concerning for possibility of Paget's disease versus osteoblastic metastasis. -May warrant further workup of findings at [facility]   Gout Patient reported having a flare last week of the right foot that has since resolved. -Continue febuxostat   Hyperlipidemia -Continue atorvastatin   Alcohol abuse Patient reports drinking half a gallon of rum and coke every 3 days.  Denies any history of alcohol withdrawals -Did not develop signs of alcohol withdrawal   Vape user Patient reports vaping.   -Counseled patient on need to quit vaping and the dangers of continuing.   The patient's chronic medical conditions were treated accordingly per the patient's home medication regimen except as noted.  On day of discharge, patient was felt deemed stable for discharge. Patient/family member advised to call PCP or come back to ER if needed.   Principal Diagnosis: Splenic infarct  Discharge Diagnoses: Active Hospital Problems   Diagnosis Date Noted   Splenic infarct 09/14/2022    Priority: 1.   Acute systolic heart failure (HCC) 09/14/2022    Priority: 2.   Prolonged QT  interval 09/13/2022    Priority: 4.   History of prostate cancer 09/13/2022    Priority: 5.   Gout 09/13/2022   Hyperlipidemia 09/13/2022   Alcohol abuse 09/13/2022   Vaping nicotine dependence, tobacco product 09/13/2022   Abnormal prominence of iliac crest 09/13/2022    Resolved Hospital Problems   Diagnosis Date Noted Date Resolved   Abdominal pain 09/13/2022 09/15/2022    Priority: 1.   Nausea and vomiting 09/13/2022 09/15/2022    Priority: 1.   Leukocytosis 09/13/2022 09/15/2022    Priority: 2.   Thrombocytosis 09/13/2022 09/15/2022    Priority: 2.   Elevated liver enzymes 09/13/2022 09/15/2022    Priority: 3.   Troponin level elevated 09/14/2022 09/15/2022   Hyperglycemia 09/13/2022 09/15/2022     Discharge Instructions     Diet general   Complete by: As directed    Increase activity slowly   Complete by: As directed       Allergies as of 09/15/2022       Reactions   Allopurinol         Medication List     TAKE these medications    aspirin EC 81 MG tablet Take 1 tablet (81 mg total) by mouth daily. Swallow whole.   atorvastatin 40 MG tablet Commonly  known as: LIPITOR Take 40 mg by mouth daily.   Eliquis 5 MG Tabs tablet Generic drug: apixaban Take 1 tablet (5 mg total) by mouth 2 (two) times daily.   Entresto 24-26 MG Generic drug: sacubitril-valsartan Take 1 tablet by mouth 2 (two) times daily.   febuxostat 40 MG tablet Commonly known as: ULORIC Take 40 mg by mouth daily.   Jardiance 10 MG Tabs tablet Generic drug: empagliflozin Take 1 tablet (10 mg total) by mouth daily.   MAGNESIUM PO Take 1 tablet by mouth daily.   metoprolol succinate 25 MG 24 hr tablet Commonly known as: TOPROL-XL Take 1 tablet (25 mg total) by mouth daily.   potassium chloride 10 MEQ tablet Commonly known as: KLOR-CON M Take 20 mEq by mouth daily.   spironolactone 25 MG tablet Commonly known as: ALDACTONE Take 0.5 tablets (12.5 mg total) by mouth daily.    VITAMIN D PO Take 1 tablet by mouth daily.        Allergies  Allergen Reactions   Allopurinol     Consultations: Cardiology  Procedures:   Discharge Exam: BP 126/86   Pulse 75   Temp 99.2 F (37.3 C) (Oral)   Resp 18   Ht 5\' 11"  (1.803 m)   Wt 86 kg   SpO2 100%   BMI 26.46 kg/m  Physical Exam Constitutional:      General: He is not in acute distress.    Appearance: Normal appearance.  HENT:     Head: Normocephalic and atraumatic.     Mouth/Throat:     Mouth: Mucous membranes are moist.  Eyes:     Extraocular Movements: Extraocular movements intact.  Cardiovascular:     Rate and Rhythm: Normal rate and regular rhythm.     Heart sounds: Normal heart sounds.  Pulmonary:     Effort: Pulmonary effort is normal. No respiratory distress.     Breath sounds: Normal breath sounds. No wheezing.  Abdominal:     General: Bowel sounds are normal. There is no distension.     Palpations: Abdomen is soft.     Tenderness: There is no abdominal tenderness.  Musculoskeletal:        General: Normal range of motion.     Cervical back: Normal range of motion and neck supple.  Skin:    General: Skin is warm and dry.  Neurological:     General: No focal deficit present.     Mental Status: He is alert.  Psychiatric:        Mood and Affect: Mood normal.        Behavior: Behavior normal.      The results of significant diagnostics from this hospitalization (including imaging, microbiology, ancillary and laboratory) are listed below for reference.   Microbiology: No results found for this or any previous visit (from the past 240 hour(s)).   Labs: BNP (last 3 results) No results for input(s): "BNP" in the last 8760 hours. Basic Metabolic Panel: Recent Labs  Lab 09/12/22 2231 09/13/22 1229 09/14/22 0254 09/15/22 0211  NA 136 134* 137 135  K 3.7 2.9* 3.7 3.0*  CL 101 102 106 100  CO2 23 21* 23 23  GLUCOSE 143* 103* 83 97  BUN 9 6 <5* 5*  CREATININE 0.78 0.82 0.95  1.02  CALCIUM 8.9 8.5* 8.4* 8.2*  MG  --  1.7  --  1.7   Liver Function Tests: Recent Labs  Lab 09/12/22 2231 09/13/22 1229 09/14/22 0254  AST 28 34  28  ALT 20 20 18   ALKPHOS 138* 143* 126  BILITOT 2.4* 2.2* 1.9*  PROT 6.8 6.2* 5.5*  ALBUMIN 3.5 3.0* 2.6*   Recent Labs  Lab 09/12/22 2231  LIPASE 15   No results for input(s): "AMMONIA" in the last 168 hours. CBC: Recent Labs  Lab 09/12/22 2231 09/13/22 1229 09/14/22 0254 09/15/22 0211  WBC 12.7* 9.5 7.2 7.9  NEUTROABS  --  6.3  --   --   HGB 13.4 13.0 12.1* 11.3*  HCT 40.3 38.0* 37.3* 34.4*  MCV 88.0 88.8 92.1 91.2  PLT 567* 468* 365 344   Cardiac Enzymes: No results for input(s): "CKTOTAL", "CKMB", "CKMBINDEX", "TROPONINI" in the last 168 hours. BNP: Invalid input(s): "POCBNP" CBG: No results for input(s): "GLUCAP" in the last 168 hours. D-Dimer Recent Labs    09/13/22 0318  DDIMER 0.71*   Hgb A1c Recent Labs    09/13/22 1229  HGBA1C 4.9   Lipid Profile No results for input(s): "CHOL", "HDL", "LDLCALC", "TRIG", "CHOLHDL", "LDLDIRECT" in the last 72 hours. Thyroid function studies Recent Labs    09/14/22 0659  TSH 4.436   Anemia work up Recent Labs    09/14/22 0254  FERRITIN 258  TIBC 277  IRON 127   Urinalysis    Component Value Date/Time   COLORURINE YELLOW 09/12/2022 2229   APPEARANCEUR CLEAR 09/12/2022 2229   LABSPEC 1.020 09/12/2022 2229   PHURINE 7.0 09/12/2022 2229   GLUCOSEU NEGATIVE 09/12/2022 2229   HGBUR NEGATIVE 09/12/2022 2229   BILIRUBINUR NEGATIVE 09/12/2022 2229   KETONESUR NEGATIVE 09/12/2022 2229   PROTEINUR TRACE (A) 09/12/2022 2229   UROBILINOGEN 0.2 06/05/2009 1016   NITRITE NEGATIVE 09/12/2022 2229   LEUKOCYTESUR NEGATIVE 09/12/2022 2229   Sepsis Labs Recent Labs  Lab 09/12/22 2231 09/13/22 1229 09/14/22 0254 09/15/22 0211  WBC 12.7* 9.5 7.2 7.9   Microbiology No results found for this or any previous visit (from the past 240  hour(s)).  Procedures/Studies: ECHOCARDIOGRAM COMPLETE  Result Date: 09/13/2022    ECHOCARDIOGRAM REPORT   Patient Name:   Albert King Date of Exam: 09/13/2022 Medical Rec #:  11/12/2022      Height:       71.0 in Accession #:    [id number]     Weight:       189.7 lb Date of Birth:  July 29, 1961       BSA:          2.062 m Patient Age:    61 years       BP:           133/76 mmHg Patient Gender: M              HR:           55 bpm. Exam Location:  Inpatient Procedure: 2D Echo, Cardiac Doppler and Color Doppler Indications:    Abnormal ECG                 Splenic infarct  History:        Patient has no prior history of Echocardiogram examinations.                 Risk Factors:Hypertension and Dyslipidemia.  Sonographer:    05/14/1961 RDCS (AE) Referring Phys: Ross Ludwig A SMITH IMPRESSIONS  1. Left ventricular ejection fraction, by estimation, is 35 to 40%. The left ventricle has moderately decreased function. The left ventricle demonstrates global hypokinesis. Left ventricular diastolic parameters are consistent with Grade I diastolic  dysfunction (impaired relaxation). There is incoordinate septal motion.  2. Right ventricular systolic function is normal. The right ventricular size is normal. Tricuspid regurgitation signal is inadequate for assessing PA pressure.  3. The mitral valve is abnormal. Trivial mitral valve regurgitation.  4. The aortic valve is tricuspid. Aortic valve regurgitation is not visualized.  5. The inferior vena cava is dilated in size with >50% respiratory variability, suggesting right atrial pressure of 8 mmHg. Comparison(s): No prior Echocardiogram. FINDINGS  Left Ventricle: Left ventricular ejection fraction, by estimation, is 35 to 40%. The left ventricle has moderately decreased function. The left ventricle demonstrates global hypokinesis. The left ventricular internal cavity size was normal in size. There is no left ventricular hypertrophy. Incoordinate septal motion. Left ventricular  diastolic parameters are consistent with Grade I diastolic dysfunction (impaired relaxation). Indeterminate filling pressures. Right Ventricle: The right ventricular size is normal. No increase in right ventricular wall thickness. Right ventricular systolic function is normal. Tricuspid regurgitation signal is inadequate for assessing PA pressure. Left Atrium: Left atrial size was normal in size. Right Atrium: Right atrial size was normal in size. Pericardium: There is no evidence of pericardial effusion. Mitral Valve: The mitral valve is abnormal. There is mild thickening of the anterior and posterior mitral valve leaflet(s). Trivial mitral valve regurgitation. Tricuspid Valve: The tricuspid valve is grossly normal. Tricuspid valve regurgitation is not demonstrated. Aortic Valve: The aortic valve is tricuspid. Aortic valve regurgitation is not visualized. Aortic valve mean gradient measures 3.0 mmHg. Aortic valve peak gradient measures 5.6 mmHg. Aortic valve area, by VTI measures 2.96 cm. Pulmonic Valve: The pulmonic valve was normal in structure. Pulmonic valve regurgitation is not visualized. Aorta: The aortic root and ascending aorta are structurally normal, with no evidence of dilitation. Venous: The inferior vena cava is dilated in size with greater than 50% respiratory variability, suggesting right atrial pressure of 8 mmHg. IAS/Shunts: No atrial level shunt detected by color flow Doppler.  LEFT VENTRICLE PLAX 2D LVIDd:         5.20 cm     Diastology LVIDs:         3.30 cm     LV e' medial:    6.85 cm/s LV PW:         1.00 cm     LV E/e' medial:  8.7 LV IVS:        1.00 cm     LV e' lateral:   5.00 cm/s LVOT diam:     2.40 cm     LV E/e' lateral: 11.9 LV SV:         74 LV SV Index:   36 LVOT Area:     4.52 cm  LV Volumes (MOD) LV vol d, MOD A2C: 88.2 ml LV vol d, MOD A4C: 90.3 ml LV vol s, MOD A2C: 53.6 ml LV vol s, MOD A4C: 64.0 ml LV SV MOD A2C:     34.6 ml LV SV MOD A4C:     90.3 ml LV SV MOD BP:       30.1 ml RIGHT VENTRICLE          IVC RV Basal diam:  2.60 cm  IVC diam: 2.20 cm TAPSE (M-mode): 2.2 cm LEFT ATRIUM           Index        RIGHT ATRIUM           Index LA diam:      3.60 cm 1.75 cm/m   RA Area:  15.20 cm LA Vol (A2C): 36.8 ml 17.85 ml/m  RA Volume:   39.10 ml  18.96 ml/m LA Vol (A4C): 58.9 ml 28.57 ml/m  AORTIC VALVE AV Area (Vmax):    3.24 cm AV Area (Vmean):   3.04 cm AV Area (VTI):     2.96 cm AV Vmax:           118.00 cm/s AV Vmean:          75.500 cm/s AV VTI:            0.251 m AV Peak Grad:      5.6 mmHg AV Mean Grad:      3.0 mmHg LVOT Vmax:         84.40 cm/s LVOT Vmean:        50.800 cm/s LVOT VTI:          0.164 m LVOT/AV VTI ratio: 0.65  AORTA Ao Root diam: 3.30 cm Ao Asc diam:  3.30 cm MITRAL VALVE MV Area (PHT): 2.86 cm    SHUNTS MV Decel Time: 265 msec    Systemic VTI:  0.16 m MV E velocity: 59.70 cm/s  Systemic Diam: 2.40 cm MV A velocity: 61.80 cm/s MV E/A ratio:  0.97 Lyman Bishop MD Electronically signed by Lyman Bishop MD Signature Date/Time: 09/13/2022/3:27:57 PM    Final    CT Angio Chest Aorta W and/or Wo Contrast  Result Date: 09/13/2022 CLINICAL DATA:  Acute aortic syndrome suspected. Splenic infarct on abdominal CT earlier same day EXAM: CT ANGIOGRAPHY CHEST WITH CONTRAST TECHNIQUE: Multidetector CT imaging of the chest was performed using the standard protocol during bolus administration of intravenous contrast. Multiplanar CT image reconstructions and MIPs were obtained to evaluate the vascular anatomy. RADIATION DOSE REDUCTION: This exam was performed according to the departmental dose-optimization program which includes automated exposure control, adjustment of the mA and/or kV according to patient size and/or use of iterative reconstruction technique. CONTRAST:  177mL OMNIPAQUE IOHEXOL 350 MG/ML SOLN COMPARISON:  CT abdomen/pelvis from earlier same day FINDINGS: Cardiovascular: Ascending thoracic aorta measures 3.3 cm diameter. Descending thoracic aorta  is 2.6 cm diameter. Mild atherosclerotic calcification is noted in the wall of the thoracic aorta. No evidence for dissection flap within the thoracic aorta. Aortic arch vessel branch anatomy is widely patent. There is some minimal calcified mural thrombus along the medial wall of the lower thoracic aorta consistent with chronic process (axial 108/4). Enlargement of the pulmonary outflow tract/main pulmonary arteries suggests pulmonary arterial hypertension. No evidence for central pulmonary embolus in the main pulmonary arteries, lobar pulmonary arteries, or segmental pulmonary arteries. Imaging on the study was carried down into the mid abdomen and the celiac axis and SMA are widely patent. Splenic artery appears widely patent without appreciable atherosclerotic disease, filling defect or aneurysm. Mediastinum/Nodes: No mediastinal lymphadenopathy. There is no hilar lymphadenopathy. The esophagus has normal imaging features. There is no axillary lymphadenopathy. Lungs/Pleura: No focal airspace consolidation. No suspicious pulmonary nodule or mass. No pulmonary edema or pleural effusion. Upper Abdomen: Deferred to dedicated abdomen and pelvis CT exam performed immediately prior to this study. Musculoskeletal: No worrisome lytic or sclerotic osseous abnormality. Review of the MIP images confirms the above findings. IMPRESSION: 1. No evidence for thoracic aortic aneurysm or dissection. There is some minimal calcified mural thrombus along the medial wall of the lower thoracic aorta consistent with chronic process. 2. Enlargement of the pulmonary outflow tract/main pulmonary arteries suggests pulmonary arterial hypertension. 3. No evidence for central pulmonary embolus. Electronically Signed  By: Kennith CenterEric  Mansell M.D.   On: 09/13/2022 05:18   CT Abdomen Pelvis W Contrast  Result Date: 09/13/2022 CLINICAL DATA:  Left lower quadrant abdominal pain. EXAM: CT ABDOMEN AND PELVIS WITH CONTRAST TECHNIQUE: Multidetector CT  imaging of the abdomen and pelvis was performed using the standard protocol following bolus administration of intravenous contrast. RADIATION DOSE REDUCTION: This exam was performed according to the departmental dose-optimization program which includes automated exposure control, adjustment of the mA and/or kV according to patient size and/or use of iterative reconstruction technique. CONTRAST:  100mL OMNIPAQUE IOHEXOL 300 MG/ML  SOLN COMPARISON:  06/05/2009. FINDINGS: Lower chest: No acute abnormality. Hepatobiliary: No focal liver abnormality is seen. Fatty infiltration of the liver is noted. No gallstones, gallbladder wall thickening, or biliary dilatation. Pancreas: Unremarkable. No pancreatic ductal dilatation or surrounding inflammatory changes. Spleen: Multifocal hypodense regions are present in the spleen, suspicious for infarcts. No subcapsular hematoma is seen. Adrenals/Urinary Tract: The adrenal glands are within normal limits. Kidneys enhance symmetrically. No renal calculus or hydronephrosis. Bladder is unremarkable. Stomach/Bowel: Stomach is within normal limits. Appendix appears normal. No evidence of bowel wall thickening, distention, or inflammatory changes. No free air or pneumatosis. Vascular/Lymphatic: Aortic atherosclerosis. No enlarged abdominal or pelvic lymph nodes. Reproductive: The prostate gland is not seen. Other: No abdominopelvic ascites. Musculoskeletal: Trabecular thickening and sclerosis are noted in the iliac bone on the left, suggesting Paget's disease. Mild degenerative changes in the thoracolumbar spine. No acute osseous abnormality IMPRESSION: 1. Regional hypodensities in the spleen, suggesting infarcts and indeterminate in age. 2. Hepatic steatosis. 3. Trabecular thickening and sclerosis involving the iliac bone on the left, possible Paget's disease versus osteoblastic metastasis. Electronically Signed   By: Thornell SartoriusLaura  Taylor M.D.   On: 09/13/2022 02:30     Time coordinating  discharge: Over 30 minutes    Lewie Chamberavid Britny Riel, MD  [facility] 09/15/2022, 11:22 AM

## 2022-09-16 ENCOUNTER — Inpatient Hospital Stay (HOSPITAL_BASED_OUTPATIENT_CLINIC_OR_DEPARTMENT_OTHER)
Admission: EM | Admit: 2022-09-16 | Discharge: 2022-09-18 | DRG: 065 | Disposition: A | Discharge status: Home / Self Care | Payer: No Typology Code available for payment source | Attending: Internal Medicine | Admitting: Internal Medicine

## 2022-09-16 ENCOUNTER — Emergency Department (HOSPITAL_BASED_OUTPATIENT_CLINIC_OR_DEPARTMENT_OTHER): Payer: No Typology Code available for payment source

## 2022-09-16 ENCOUNTER — Other Ambulatory Visit: Payer: Self-pay

## 2022-09-16 ENCOUNTER — Encounter (HOSPITAL_BASED_OUTPATIENT_CLINIC_OR_DEPARTMENT_OTHER): Payer: Self-pay

## 2022-09-16 DIAGNOSIS — E7800 Pure hypercholesterolemia, unspecified: Secondary | ICD-10-CM | POA: Diagnosis present

## 2022-09-16 DIAGNOSIS — Z9079 Acquired absence of other genital organ(s): Secondary | ICD-10-CM

## 2022-09-16 DIAGNOSIS — Z7982 Long term (current) use of aspirin: Secondary | ICD-10-CM

## 2022-09-16 DIAGNOSIS — R209 Unspecified disturbances of skin sensation: Secondary | ICD-10-CM | POA: Diagnosis present

## 2022-09-16 DIAGNOSIS — R531 Weakness: Principal | ICD-10-CM

## 2022-09-16 DIAGNOSIS — E871 Hypo-osmolality and hyponatremia: Secondary | ICD-10-CM | POA: Diagnosis present

## 2022-09-16 DIAGNOSIS — M109 Gout, unspecified: Secondary | ICD-10-CM | POA: Diagnosis present

## 2022-09-16 DIAGNOSIS — R29700 NIHSS score 0: Secondary | ICD-10-CM | POA: Diagnosis present

## 2022-09-16 DIAGNOSIS — I63432 Cerebral infarction due to embolism of left posterior cerebral artery: Principal | ICD-10-CM | POA: Diagnosis present

## 2022-09-16 DIAGNOSIS — F1010 Alcohol abuse, uncomplicated: Secondary | ICD-10-CM | POA: Diagnosis present

## 2022-09-16 DIAGNOSIS — R200 Anesthesia of skin: Secondary | ICD-10-CM | POA: Diagnosis not present

## 2022-09-16 DIAGNOSIS — Q742 Other congenital malformations of lower limb(s), including pelvic girdle: Secondary | ICD-10-CM

## 2022-09-16 DIAGNOSIS — F17290 Nicotine dependence, other tobacco product, uncomplicated: Secondary | ICD-10-CM | POA: Diagnosis present

## 2022-09-16 DIAGNOSIS — I5020 Unspecified systolic (congestive) heart failure: Secondary | ICD-10-CM | POA: Diagnosis present

## 2022-09-16 DIAGNOSIS — I639 Cerebral infarction, unspecified: Secondary | ICD-10-CM | POA: Diagnosis present

## 2022-09-16 DIAGNOSIS — Z888 Allergy status to other drugs, medicaments and biological substances status: Secondary | ICD-10-CM

## 2022-09-16 DIAGNOSIS — I5022 Chronic systolic (congestive) heart failure: Secondary | ICD-10-CM | POA: Diagnosis present

## 2022-09-16 DIAGNOSIS — I7410 Embolism and thrombosis of unspecified parts of aorta: Secondary | ICD-10-CM

## 2022-09-16 DIAGNOSIS — Z7901 Long term (current) use of anticoagulants: Secondary | ICD-10-CM

## 2022-09-16 DIAGNOSIS — D72829 Elevated white blood cell count, unspecified: Secondary | ICD-10-CM | POA: Diagnosis present

## 2022-09-16 DIAGNOSIS — D735 Infarction of spleen: Secondary | ICD-10-CM | POA: Diagnosis present

## 2022-09-16 DIAGNOSIS — E785 Hyperlipidemia, unspecified: Secondary | ICD-10-CM | POA: Diagnosis present

## 2022-09-16 DIAGNOSIS — Z8546 Personal history of malignant neoplasm of prostate: Secondary | ICD-10-CM

## 2022-09-16 DIAGNOSIS — Z7984 Long term (current) use of oral hypoglycemic drugs: Secondary | ICD-10-CM

## 2022-09-16 DIAGNOSIS — E876 Hypokalemia: Secondary | ICD-10-CM | POA: Diagnosis present

## 2022-09-16 DIAGNOSIS — R748 Abnormal levels of other serum enzymes: Secondary | ICD-10-CM | POA: Diagnosis present

## 2022-09-16 DIAGNOSIS — Z79899 Other long term (current) drug therapy: Secondary | ICD-10-CM

## 2022-09-16 DIAGNOSIS — R9431 Abnormal electrocardiogram [ECG] [EKG]: Secondary | ICD-10-CM | POA: Diagnosis present

## 2022-09-16 DIAGNOSIS — I513 Intracardiac thrombosis, not elsewhere classified: Secondary | ICD-10-CM | POA: Diagnosis present

## 2022-09-16 DIAGNOSIS — I110 Hypertensive heart disease with heart failure: Secondary | ICD-10-CM | POA: Diagnosis present

## 2022-09-16 LAB — COMPREHENSIVE METABOLIC PANEL
ALT: 26 U/L (ref 0–44)
AST: 43 U/L — ABNORMAL HIGH (ref 15–41)
Albumin: 3.5 g/dL (ref 3.5–5.0)
Alkaline Phosphatase: 132 U/L — ABNORMAL HIGH (ref 38–126)
Anion gap: 10 (ref 5–15)
BUN: 7 mg/dL (ref 6–20)
CO2: 22 mmol/L (ref 22–32)
Calcium: 9.1 mg/dL (ref 8.9–10.3)
Chloride: 102 mmol/L (ref 98–111)
Creatinine, Ser: 1.13 mg/dL (ref 0.61–1.24)
GFR, Estimated: >60 mL/min (ref >60)
Glucose, Bld: 91 mg/dL (ref 70–99)
Potassium: 3.4 mmol/L — ABNORMAL LOW (ref 3.5–5.1)
Sodium: 134 mmol/L — ABNORMAL LOW (ref 135–145)
Total Bilirubin: 0.7 mg/dL (ref 0.3–1.2)
Total Protein: 6.8 g/dL (ref 6.5–8.1)

## 2022-09-16 LAB — URINALYSIS, ROUTINE W REFLEX MICROSCOPIC
Bacteria, UA: NONE SEEN
Bilirubin Urine: NEGATIVE
Glucose, UA: 500 mg/dL — AB
Hgb urine dipstick: NEGATIVE
Ketones, ur: NEGATIVE mg/dL
Leukocytes,Ua: NEGATIVE
Nitrite: NEGATIVE
Protein, ur: NEGATIVE mg/dL
Specific Gravity, Urine: 1.007 (ref 1.005–1.030)
pH: 6 (ref 5.0–8.0)

## 2022-09-16 LAB — CBC
HCT: 37.2 % — ABNORMAL LOW (ref 39.0–52.0)
Hemoglobin: 12.5 g/dL — ABNORMAL LOW (ref 13.0–17.0)
MCH: 30 pg (ref 26.0–34.0)
MCHC: 33.6 g/dL (ref 30.0–36.0)
MCV: 89.2 fL (ref 80.0–100.0)
Platelets: 402 10*3/uL — ABNORMAL HIGH (ref 150–400)
RBC: 4.17 MIL/uL — ABNORMAL LOW (ref 4.22–5.81)
RDW: 16.5 % — ABNORMAL HIGH (ref 11.5–15.5)
WBC: 10.7 10*3/uL — ABNORMAL HIGH (ref 4.0–10.5)
nRBC: 0 % (ref 0.0–0.2)

## 2022-09-16 LAB — RAPID URINE DRUG SCREEN, HOSP PERFORMED
Amphetamines: NOT DETECTED
Barbiturates: NOT DETECTED
Benzodiazepines: NOT DETECTED
Cocaine: NOT DETECTED
Opiates: NOT DETECTED
Tetrahydrocannabinol: NOT DETECTED

## 2022-09-16 LAB — DIFFERENTIAL
Abs Immature Granulocytes: 0.33 10*3/uL — ABNORMAL HIGH (ref 0.00–0.07)
Basophils Absolute: 0.1 10*3/uL (ref 0.0–0.1)
Basophils Relative: 1 %
Eosinophils Absolute: 0.1 10*3/uL (ref 0.0–0.5)
Eosinophils Relative: 1 %
Immature Granulocytes: 3 %
Lymphocytes Relative: 21 %
Lymphs Abs: 2.3 10*3/uL (ref 0.7–4.0)
Monocytes Absolute: 1.7 10*3/uL — ABNORMAL HIGH (ref 0.1–1.0)
Monocytes Relative: 16 %
Neutro Abs: 6.2 10*3/uL (ref 1.7–7.7)
Neutrophils Relative %: 58 %

## 2022-09-16 LAB — APTT: aPTT: 30 seconds (ref 24–36)

## 2022-09-16 LAB — ETHANOL: Alcohol, Ethyl (B): <10 mg/dL (ref <10)

## 2022-09-16 LAB — ANA W/REFLEX IF POSITIVE: Anti Nuclear Antibody (ANA): NEGATIVE

## 2022-09-16 LAB — CBG MONITORING, ED: Glucose-Capillary: 98 mg/dL (ref 70–99)

## 2022-09-16 LAB — PROTIME-INR
INR: 1.2 (ref 0.8–1.2)
Prothrombin Time: 14.7 seconds (ref 11.4–15.2)

## 2022-09-16 MED ORDER — ASPIRIN 325 MG PO TABS
325.0000 mg | ORAL_TABLET | Freq: Every day | ORAL | Status: DC
  Administered 2022-09-16: 325 mg via ORAL
  Filled 2022-09-16: qty 1

## 2022-09-16 MED ORDER — IOHEXOL 350 MG/ML SOLN
100.0000 mL | Freq: Once | INTRAVENOUS | Status: AC | PRN
  Administered 2022-09-16: 75 mL via INTRAVENOUS

## 2022-09-16 NOTE — ED Provider Notes (Addendum)
[facility] Provider Note   CSN: [id number] Arrival date & time: 09/16/22  2020     History  Chief Complaint  Patient presents with   Numbness    Norma Ignasiak is a 62 y.o. male.  HPI   62 year old male medical history significant for HTN, HLD who presents to the emergency department with concern for stroke.  The patient states that he developed right hand numbness around 1915 today.  He also noticed decreased grip strength with difficulty holding a thermos on the right.  He states that the difficulty with strength has been slowly improving over the last few hours but states that he still has persistent numbness in the right forearm.  He denies any facial droop, dysarthria, dysphagia, numbness or weakness in the lower extremities.  Home Medications Prior to Admission medications   Medication Sig Start Date End Date Taking? Authorizing Provider  apixaban (ELIQUIS) 5 MG TABS tablet Take 1 tablet (5 mg total) by mouth 2 (two) times daily. 09/15/22   Dwyane Dee, MD  aspirin EC 81 MG tablet Take 1 tablet (81 mg total) by mouth daily. Swallow whole. 09/15/22   Dwyane Dee, MD  atorvastatin (LIPITOR) 40 MG tablet Take 40 mg by mouth daily.    [provider]  empagliflozin (JARDIANCE) 10 MG TABS tablet Take 1 tablet (10 mg total) by mouth daily. 09/15/22   Dwyane Dee, MD  febuxostat (ULORIC) 40 MG tablet Take 40 mg by mouth daily.    [provider]  MAGNESIUM PO Take 1 tablet by mouth daily.    [provider]  metoprolol succinate (TOPROL-XL) 25 MG 24 hr tablet Take 1 tablet (25 mg total) by mouth daily. 09/15/22   Dwyane Dee, MD  potassium chloride (K-DUR,KLOR-CON) 10 MEQ tablet Take 20 mEq by mouth daily.    [provider]  sacubitril-valsartan (ENTRESTO) 24-26 MG Take 1 tablet by mouth 2 (two) times daily. 09/15/22   Dwyane Dee, MD  spironolactone (ALDACTONE) 25 MG tablet Take 0.5 tablets (12.5 mg  total) by mouth daily. 09/15/22   Dwyane Dee, MD  VITAMIN D PO Take 1 tablet by mouth daily.    [provider]      Allergies    Allopurinol    Review of Systems   Review of Systems  Unable to perform ROS: Acuity of condition    Physical Exam Updated Vital Signs BP 117/72   Pulse 63   Temp 98.5 F (36.9 C) (Oral)   Resp 20   Ht 5\' 11"  (1.803 m)   Wt 86 kg   SpO2 99%   BMI 26.44 kg/m  Physical Exam Vitals and nursing note reviewed.  Constitutional:      General: He is not in acute distress. HENT:     Head: Normocephalic and atraumatic.  Eyes:     Conjunctiva/sclera: Conjunctivae normal.     Pupils: Pupils are equal, round, and reactive to light.  Cardiovascular:     Rate and Rhythm: Normal rate and regular rhythm.  Pulmonary:     Effort: Pulmonary effort is normal. No respiratory distress.  Abdominal:     General: There is no distension.     Tenderness: There is no guarding.  Musculoskeletal:        General: No deformity or signs of injury.     Cervical back: Neck supple.  Skin:    Findings: No lesion or rash.  Neurological:     Mental Status: He is  alert.     Comments: MENTAL STATUS EXAM:    Orientation: Alert and oriented to person, place and time.  Memory: Cooperative, follows commands well.  Language: Speech is clear and language is normal.   CRANIAL NERVES:    CN 2 (Optic): Visual fields intact to confrontation.  CN 3,4,6 (EOM): Pupils equal and reactive to light. Full extraocular eye movement without nystagmus.  CN 5 (Trigeminal): Facial sensation is normal, no weakness of masticatory muscles.  CN 7 (Facial): No facial weakness or asymmetry.  CN 8 (Auditory): Auditory acuity grossly normal.  CN 9,10 (Glossophar): The uvula is midline, the palate elevates symmetrically.  CN 11 (spinal access): Normal sternocleidomastoid and trapezius strength.  CN 12 (Hypoglossal): The tongue is midline. No atrophy or fasciculations.Marland Kitchen   MOTOR:  Muscle  Strength: 5/5RUE, 5/5LUE, 5/5RLE, 5/5LLE.   SENSATION:   Intact to light touch all four extremities.        ED Results / Procedures / Treatments   Labs (all labs ordered are listed, but only abnormal results are displayed) Labs Reviewed  CBC - Abnormal; Notable for the following components:      Result Value   WBC 10.7 (*)    RBC 4.17 (*)    Hemoglobin 12.5 (*)    HCT 37.2 (*)    RDW 16.5 (*)    Platelets 402 (*)    All other components within normal limits  DIFFERENTIAL - Abnormal; Notable for the following components:   Monocytes Absolute 1.7 (*)    Abs Immature Granulocytes 0.33 (*)    All other components within normal limits  COMPREHENSIVE METABOLIC PANEL - Abnormal; Notable for the following components:   Sodium 134 (*)    Potassium 3.4 (*)    AST 43 (*)    Alkaline Phosphatase 132 (*)    All other components within normal limits  URINALYSIS, ROUTINE W REFLEX MICROSCOPIC - Abnormal; Notable for the following components:   Glucose, UA 500 (*)    All other components within normal limits  ETHANOL  PROTIME-INR  APTT  RAPID URINE DRUG SCREEN, HOSP PERFORMED  CBG MONITORING, ED    EKG EKG Interpretation  Date/Time:  Tuesday September 16 2022 21:43:22 EST Ventricular Rate:  69 PR Interval:  176 QRS Duration: 90 QT Interval:  500 QTC Calculation: 536 R Axis:   54 Text Interpretation: Sinus rhythm Repol abnrm suggests ischemia, anterolateral Prolonged QT interval Confirmed by Regan Lemming ([id number]) on 09/16/2022 9:54:07 PM  Radiology CT HEAD CODE STROKE WO CONTRAST  Result Date: 09/16/2022 CLINICAL DATA:  Code stroke. Neuro deficit. Acute. Stroke suspected. Right hand numbness. EXAM: CT HEAD WITHOUT CONTRAST TECHNIQUE: Contiguous axial images were obtained from the base of the skull through the vertex without intravenous contrast. RADIATION DOSE REDUCTION: This exam was performed according to the departmental dose-optimization program which includes automated exposure  control, adjustment of the mA and/or kV according to patient size and/or use of iterative reconstruction technique. COMPARISON:  None Available. FINDINGS: Brain: Normal appearance without evidence of old or acute infarction, mass lesion, hemorrhage, hydrocephalus or extra-axial collection. Vascular: There is atherosclerotic calcification of the major vessels at the base of the brain. Skull: Normal Sinuses/Orbits: Mild seasonal mucosal thickening.  Orbits negative. Other: None ASPECTS ([facility]kins Stroke Program Early CT Score) - Ganglionic level infarction (caudate, lentiform nuclei, internal capsule, insula, M1-M3 cortex): 7 - Supraganglionic infarction (M4-M6 cortex): 3 Total score (0-10 with 10 being normal): 10 IMPRESSION: 1. Normal head CT. 2. Aspects is 10. These results  were called by telephone at the time of interpretation on 09/16/2022 at 9:52 pm to provider Regan Lemming , who verbally acknowledged these results. Electronically Signed   By: Nelson Chimes M.D.   On: 09/16/2022 21:54    Procedures Procedures    Medications Ordered in ED Medications  aspirin tablet 325 mg (325 mg Oral Given 09/16/22 2226)  iohexol (OMNIPAQUE) 350 MG/ML injection 100 mL (has no administration in time range)    ED Course/ Medical Decision Making/ A&P                             Medical Decision Making Amount and/or Complexity of Data Reviewed Labs: ordered. Radiology: ordered.  Risk OTC drugs. Prescription drug management. Decision regarding hospitalization.    62 year old male medical history significant for HTN, HLD who presents to the emergency department with concern for stroke.  The patient states that he developed right hand numbness around 1915 today.  He also noticed decreased grip strength with difficulty holding a thermos on the right.  He states that the difficulty with strength has been slowly improving over the last few hours but states that he still has persistent numbness in the right forearm.   He denies any facial droop, dysarthria, dysphagia, numbness or weakness in the lower extremities.  On arrival, the patient was vitally stable.  Initial triage present patient had mentioned only numbness in the right hand however upon my initial evaluation, the patient stated that he had difficulty with right hand grip strength.  Presenting with acute onset right grip strength weakness with associated numbness.  Symptoms are resolving however persist.  Due to this, code stroke was initiated on my evaluation.  Initial CBG was 98.  Teleneurology was consulted.  The patient was administered 325 mg of aspirin orally.  CT code stroke imaging was performed which resulted negative for acute abnormality.  Dr. Wolfgang Phoenix of teleneurology recommended admission for stroke workup given his acute symptoms.  Remainder of laboratory evaluation significant for CBC with a mild leukocytosis to 10.7, mild anemia to 12.5, CMP with mild hypokalemia to 3.4, otherwise generally unremarkable, urinalysis without evidence of UTI, UDS negative, ethanol level normal.  Hospitalist medicine was consulted for admission for observation.  Discussed with Dr. Bridgett Larsson, on-call hospitalist medicine who declined immediate admission, recommended transfer ED to ED for MRI imaging.  If MRI imaging shows stroke, recommended neuroconsult and admission, if negative, recommended discharge.  Signout given to Dr. Stark Jock pending CTA imaging, transfer acceptance to [facility].  Final Clinical Impression(s) / ED Diagnoses Final diagnoses:  Weakness  Numbness    Rx / DC Orders ED Discharge Orders     None         Regan Lemming, MD 09/16/22 1308    Regan Lemming, MD 09/16/22 2339

## 2022-09-16 NOTE — Consult Note (Signed)
[facility] TeleNeurology Consult Services   Patient Name:   Albert King, Albert King Date of Birth:   1961-03-24 Identification Number:   MRN - [id number] Date of Service:   09/16/2022 21:40:51  Diagnosis:       I63.9 - Cerebrovascular accident (CVA), unspecified mechanism ([place])  Impression:      62 yo man presenting for evaluation of right arm and hand numbness and weakness, now resolved. Overall his exam is reassuring with normal head CT. He has significant risk factors for stroke. For now ok to continue eliquis and aspirin. Obtain brain MRI. PT/OT evaluation.    Discussed importance of stopping etoh consumption and vaping as well.  Our recommendations are outlined below.  Recommendations:        Stroke/Telemetry Floor       Neuro Checks       Bedside Swallow Eval       DVT Prophylaxis       IV Fluids, Normal Saline       Head of Bed 30 Degrees       Euglycemia and Avoid Hyperthermia (PRN Acetaminophen)  Sign Out:       Discussed with Emergency Department Provider    ------------------------------------------------------------------------------  Advanced Imaging: Advanced Imaging Deferred because:  Non-disabling symptoms as verified by the patient; no cortical signs so not consistent with LVO   Metrics: Last Known Well: 09/16/2022 19:15:00 [facility] Notification Time: 09/16/2022 21:40:51 Arrival Time: 09/16/2022 20:20:00 Stamp Time: 09/16/2022 21:40:51 Initial Response Time: 09/16/2022 21:48:24 Symptoms: right arm numbness and weakness. Initial patient interaction: 09/16/2022 22:13:23 NIHSS Assessment Completed: 09/16/2022 22:20:43 Patient is not a candidate for Thrombolytic. Thrombolytic Medical Decision: 09/16/2022 22:20:48 Patient was not deemed candidate for Thrombolytic because of following reasons: Use of NOAs within 48 hours. Resolved symptoms (no residual disabling symptoms).  CT head showed no acute hemorrhage or acute core infarct.  Primary  Provider Notified of Diagnostic Impression and Management Plan on: 09/16/2022 22:51:19    ------------------------------------------------------------------------------  History of Present Illness: Patient is a 61 year old Male.  Patient was brought by private transportation with symptoms of right arm numbness and weakness. This is a 62 yo man with history of significant etoh intake, recent dx of heart failure, recent spenic infarct and now on eliquis who presents for evaluation of right arm tingling and weakness, especially of the hand. This started around 715pm. It improved and then worsened again however by the time I see him, he is much better and almost back to baseline.  Exam is reassuring. Head CT did not show acute pathology.   Past Medical History:      Hypertension      Hyperlipidemia [place] PMH:  prostate cancer  Medications:  Anticoagulant use:  Yes eliquis Antiplatelet use: Yes asa 81 Reviewed EMR for current medications  Allergies:  Reviewed  Social History: Alcohol Use: Yes  Family History:  There is no family history of premature cerebrovascular disease pertinent to this consultation  ROS : 14 Points Review of Systems was performed and was negative except mentioned in HPI.  Past Surgical History: There Is No Surgical History Contributory To Today's Visit    Examination: BP(102/73), Pulse(83), Blood Glucose(98) 1A: Level of Consciousness - Alert; keenly responsive + 0 1B: Ask Month and Age - Both Questions Right + 0 1C: Blink Eyes & Squeeze Hands - Performs Both Tasks + 0 2: Test Horizontal Extraocular Movements - Normal + 0 3: Test Visual Fields - No Visual Loss + 0 4: Test Facial Palsy (Use Grimace  if Obtunded) - Normal symmetry + 0 5A: Test Left Arm Motor Drift - No Drift for 10 Seconds + 0 5B: Test Right Arm Motor Drift - No Drift for 10 Seconds + 0 6A: Test Left Leg Motor Drift - No Drift for 5 Seconds + 0 6B: Test Right Leg Motor Drift - No  Drift for 5 Seconds + 0 7: Test Limb Ataxia (FNF/Heel-Shin) - No Ataxia + 0 8: Test Sensation - Normal; No sensory loss + 0 9: Test Language/Aphasia - Normal; No aphasia + 0 10: Test Dysarthria - Normal + 0 11: Test Extinction/Inattention - No abnormality + 0  NIHSS Score: 0   Pre-Morbid Modified Rankin Scale: 0 Points = No symptoms at all  Spoke with : Dr. Armandina Gemma  Patient/Family was informed the Neurology Consult would occur via TeleHealth consult by way of interactive audio and video telecommunications and consented to receiving care in this manner.   Patient is being evaluated for possible acute neurologic impairment and high probability of imminent or life-threatening deterioration. I spent total of 35 minutes providing care to this patient, including time for face to face visit via telemedicine, review of medical records, imaging studies and discussion of findings with providers, the patient and/or family.   Dr Precious Gilding   [facility] For Inpatient follow-up with [facility] physician please call [facility] [phone]. This is not an outpatient service. Post hospital discharge, please contact hospital directly.  Please do not communicate with [facility] physicians via secure chat. If you have any questions, Please contact [facility]. Please call or reconsult our service if there are any clinical or diagnostic changes.

## 2022-09-16 NOTE — ED Triage Notes (Signed)
Patient here POV from Home.  Endorses Right Hand Numbness that began at 1915 today. Intermittent in nature. No Pain.   NAD Noted during Triage. A&Ox4. GCS 15. Ambulatory.

## 2022-09-16 NOTE — Consult Note (Signed)
Telestroke cart was activated at 2130. Per treatment team, pt's LKW was at 1915 with c/o R arm numbness/weakness. EDP assessed pt prior to cart activation. Pt transported to CT at 2136 and returned to room at 2140. [facility] was paged for code stroke at 2140. TS contacted [facility] at 2150 stating that provider will be on cart shortly. Dr, Wolfgang Phoenix, [facility] appeared on telestroke cart at 2211 to assess the patient. Based on [facility]'s assessment, pt does not meet criteria for emergent interventions at this time 2/2 NOAs within 48hrs and no disabling symptoms. [facility] to f/u with EDP regarding recommendations. No further needs from Telestroke RN at this time. Telestroke cart disconnected at 2222.

## 2022-09-16 NOTE — ED Notes (Signed)
Awaiting neuro assessment with teleneuro.

## 2022-09-16 NOTE — ED Notes (Signed)
Patient transported to CT 

## 2022-09-17 ENCOUNTER — Emergency Department (HOSPITAL_COMMUNITY): Payer: No Typology Code available for payment source

## 2022-09-17 DIAGNOSIS — Z7984 Long term (current) use of oral hypoglycemic drugs: Secondary | ICD-10-CM | POA: Diagnosis not present

## 2022-09-17 DIAGNOSIS — R748 Abnormal levels of other serum enzymes: Secondary | ICD-10-CM

## 2022-09-17 DIAGNOSIS — I63432 Cerebral infarction due to embolism of left posterior cerebral artery: Secondary | ICD-10-CM | POA: Diagnosis present

## 2022-09-17 DIAGNOSIS — F1010 Alcohol abuse, uncomplicated: Secondary | ICD-10-CM | POA: Diagnosis present

## 2022-09-17 DIAGNOSIS — Z888 Allergy status to other drugs, medicaments and biological substances status: Secondary | ICD-10-CM | POA: Diagnosis not present

## 2022-09-17 DIAGNOSIS — D649 Anemia, unspecified: Secondary | ICD-10-CM | POA: Diagnosis not present

## 2022-09-17 DIAGNOSIS — I110 Hypertensive heart disease with heart failure: Secondary | ICD-10-CM | POA: Diagnosis present

## 2022-09-17 DIAGNOSIS — E785 Hyperlipidemia, unspecified: Secondary | ICD-10-CM

## 2022-09-17 DIAGNOSIS — R9431 Abnormal electrocardiogram [ECG] [EKG]: Secondary | ICD-10-CM

## 2022-09-17 DIAGNOSIS — D735 Infarction of spleen: Secondary | ICD-10-CM | POA: Diagnosis present

## 2022-09-17 DIAGNOSIS — I5020 Unspecified systolic (congestive) heart failure: Secondary | ICD-10-CM

## 2022-09-17 DIAGNOSIS — R209 Unspecified disturbances of skin sensation: Secondary | ICD-10-CM | POA: Diagnosis present

## 2022-09-17 DIAGNOSIS — F17290 Nicotine dependence, other tobacco product, uncomplicated: Secondary | ICD-10-CM | POA: Diagnosis present

## 2022-09-17 DIAGNOSIS — I081 Rheumatic disorders of both mitral and tricuspid valves: Secondary | ICD-10-CM | POA: Diagnosis not present

## 2022-09-17 DIAGNOSIS — D72829 Elevated white blood cell count, unspecified: Secondary | ICD-10-CM

## 2022-09-17 DIAGNOSIS — Q742 Other congenital malformations of lower limb(s), including pelvic girdle: Secondary | ICD-10-CM

## 2022-09-17 DIAGNOSIS — I5022 Chronic systolic (congestive) heart failure: Secondary | ICD-10-CM | POA: Diagnosis present

## 2022-09-17 DIAGNOSIS — F17210 Nicotine dependence, cigarettes, uncomplicated: Secondary | ICD-10-CM | POA: Diagnosis not present

## 2022-09-17 DIAGNOSIS — I513 Intracardiac thrombosis, not elsewhere classified: Secondary | ICD-10-CM | POA: Diagnosis present

## 2022-09-17 DIAGNOSIS — Z79899 Other long term (current) drug therapy: Secondary | ICD-10-CM | POA: Diagnosis not present

## 2022-09-17 DIAGNOSIS — Z9079 Acquired absence of other genital organ(s): Secondary | ICD-10-CM | POA: Diagnosis not present

## 2022-09-17 DIAGNOSIS — I7410 Embolism and thrombosis of unspecified parts of aorta: Secondary | ICD-10-CM

## 2022-09-17 DIAGNOSIS — M109 Gout, unspecified: Secondary | ICD-10-CM | POA: Diagnosis present

## 2022-09-17 DIAGNOSIS — R29700 NIHSS score 0: Secondary | ICD-10-CM | POA: Diagnosis present

## 2022-09-17 DIAGNOSIS — Z8546 Personal history of malignant neoplasm of prostate: Secondary | ICD-10-CM | POA: Diagnosis not present

## 2022-09-17 DIAGNOSIS — E871 Hypo-osmolality and hyponatremia: Secondary | ICD-10-CM | POA: Diagnosis present

## 2022-09-17 DIAGNOSIS — I639 Cerebral infarction, unspecified: Secondary | ICD-10-CM | POA: Diagnosis not present

## 2022-09-17 DIAGNOSIS — E7800 Pure hypercholesterolemia, unspecified: Secondary | ICD-10-CM | POA: Diagnosis present

## 2022-09-17 DIAGNOSIS — R200 Anesthesia of skin: Secondary | ICD-10-CM | POA: Diagnosis present

## 2022-09-17 DIAGNOSIS — I10 Essential (primary) hypertension: Secondary | ICD-10-CM | POA: Diagnosis not present

## 2022-09-17 DIAGNOSIS — E876 Hypokalemia: Secondary | ICD-10-CM | POA: Diagnosis present

## 2022-09-17 DIAGNOSIS — Z7982 Long term (current) use of aspirin: Secondary | ICD-10-CM | POA: Diagnosis not present

## 2022-09-17 DIAGNOSIS — Z7901 Long term (current) use of anticoagulants: Secondary | ICD-10-CM | POA: Diagnosis not present

## 2022-09-17 LAB — LIPID PANEL
Cholesterol: 196 mg/dL (ref 0–200)
HDL: 56 mg/dL (ref >40)
LDL Cholesterol: 115 mg/dL — ABNORMAL HIGH (ref 0–99)
Total CHOL/HDL Ratio: 3.5 RATIO
Triglycerides: 124 mg/dL (ref <150)
VLDL: 25 mg/dL (ref 0–40)

## 2022-09-17 MED ORDER — ASPIRIN 81 MG PO TBEC
81.0000 mg | DELAYED_RELEASE_TABLET | Freq: Every day | ORAL | Status: DC
  Administered 2022-09-17: 81 mg via ORAL
  Filled 2022-09-17: qty 1

## 2022-09-17 MED ORDER — ACETAMINOPHEN 650 MG RE SUPP: 650.0000 mg | Freq: Four times a day (QID) | RECTAL | Status: DC | PRN

## 2022-09-17 MED ORDER — ASPIRIN 81 MG PO TBEC
81.0000 mg | DELAYED_RELEASE_TABLET | Freq: Every day | ORAL | Status: DC
  Administered 2022-09-18: 81 mg via ORAL
  Filled 2022-09-17: qty 1

## 2022-09-17 MED ORDER — ACETAMINOPHEN 325 MG PO TABS: 650.0000 mg | ORAL_TABLET | Freq: Four times a day (QID) | ORAL | Status: DC | PRN

## 2022-09-17 MED ORDER — FEBUXOSTAT 40 MG PO TABS
40.0000 mg | ORAL_TABLET | Freq: Every day | ORAL | Status: DC
  Administered 2022-09-17 – 2022-09-18 (×2): 40 mg via ORAL
  Filled 2022-09-17 (×2): qty 1

## 2022-09-17 MED ORDER — MAGNESIUM SULFATE 2 GM/50ML IV SOLN
2.0000 g | Freq: Once | INTRAVENOUS | Status: AC
  Administered 2022-09-17: 2 g via INTRAVENOUS
  Filled 2022-09-17: qty 50

## 2022-09-17 MED ORDER — ATORVASTATIN CALCIUM 40 MG PO TABS
40.0000 mg | ORAL_TABLET | Freq: Every day | ORAL | Status: DC
  Administered 2022-09-17 – 2022-09-18 (×2): 40 mg via ORAL
  Filled 2022-09-17 (×2): qty 1

## 2022-09-17 MED ORDER — APIXABAN 5 MG PO TABS
5.0000 mg | ORAL_TABLET | Freq: Two times a day (BID) | ORAL | Status: DC
  Administered 2022-09-17 – 2022-09-18 (×3): 5 mg via ORAL
  Filled 2022-09-17 (×3): qty 1

## 2022-09-17 MED ORDER — SODIUM CHLORIDE 0.9 % IV SOLN: INTRAVENOUS | Status: DC

## 2022-09-17 MED ORDER — SACUBITRIL-VALSARTAN 24-26 MG PO TABS
1.0000 | ORAL_TABLET | Freq: Two times a day (BID) | ORAL | Status: DC
  Administered 2022-09-17 – 2022-09-18 (×3): 1 via ORAL
  Filled 2022-09-17 (×3): qty 1

## 2022-09-17 MED ORDER — SPIRONOLACTONE 12.5 MG HALF TABLET
12.5000 mg | ORAL_TABLET | Freq: Every day | ORAL | Status: DC
  Administered 2022-09-17: 12.5 mg via ORAL
  Filled 2022-09-17 (×2): qty 1

## 2022-09-17 MED ORDER — SODIUM CHLORIDE 0.9% FLUSH
3.0000 mL | Freq: Two times a day (BID) | INTRAVENOUS | Status: DC
  Administered 2022-09-17 – 2022-09-18 (×3): 3 mL via INTRAVENOUS

## 2022-09-17 MED ORDER — ALBUTEROL SULFATE (2.5 MG/3ML) 0.083% IN NEBU: 2.5000 mg | INHALATION_SOLUTION | Freq: Four times a day (QID) | RESPIRATORY_TRACT | Status: DC | PRN

## 2022-09-17 MED ORDER — TRIMETHOBENZAMIDE HCL 100 MG/ML IM SOLN: 200.0000 mg | Freq: Four times a day (QID) | INTRAMUSCULAR | Status: DC | PRN

## 2022-09-17 MED ORDER — POTASSIUM CHLORIDE CRYS ER 20 MEQ PO TBCR
20.0000 meq | EXTENDED_RELEASE_TABLET | Freq: Every day | ORAL | Status: DC
  Administered 2022-09-18: 20 meq via ORAL
  Filled 2022-09-17: qty 1

## 2022-09-17 MED ORDER — EMPAGLIFLOZIN 10 MG PO TABS
10.0000 mg | ORAL_TABLET | Freq: Every day | ORAL | Status: DC
  Administered 2022-09-17 – 2022-09-18 (×2): 10 mg via ORAL
  Filled 2022-09-17 (×2): qty 1

## 2022-09-17 MED ORDER — POTASSIUM CHLORIDE CRYS ER 20 MEQ PO TBCR
20.0000 meq | EXTENDED_RELEASE_TABLET | Freq: Every day | ORAL | Status: DC
  Filled 2022-09-17: qty 1

## 2022-09-17 MED ORDER — POTASSIUM CHLORIDE CRYS ER 20 MEQ PO TBCR
40.0000 meq | EXTENDED_RELEASE_TABLET | ORAL | Status: AC
  Administered 2022-09-17: 40 meq via ORAL
  Filled 2022-09-17: qty 2

## 2022-09-17 MED ORDER — METOPROLOL SUCCINATE ER 25 MG PO TB24
25.0000 mg | ORAL_TABLET | Freq: Every day | ORAL | Status: DC
  Administered 2022-09-17 – 2022-09-18 (×2): 25 mg via ORAL
  Filled 2022-09-17 (×2): qty 1

## 2022-09-17 NOTE — Progress Notes (Signed)
    [place] has been requested to perform a transesophageal echocardiogram on Albert King for evaluation of suspected embolic CVA. Per chart review, patient is a 62 year old male with a past medical history of HTN, HLD, gout. He presented to the ED on 2/6 complaining of right hand numbness and weakness. 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 Likely embolic in nature. Of note, patient was recently diagnosed with splenic infarcts.   Echocardiogram from 09/13/22 showed EF 15-40%, grade I diastolic dysfunction, normal RV systolic function.  TEE was requested for further evaluation of embolic CVA.   After careful review of history and examination, the risks and benefits of transesophageal echocardiogram have been explained including risks of esophageal damage, perforation (1:10,000 risk), bleeding, pharyngeal hematoma as well as other potential complications associated with conscious sedation including aspiration, arrhythmia, respiratory failure and death. Alternatives to treatment were discussed, questions were answered. Patient is willing to proceed.   Margie Billet, PA-C 09/17/2022 2:06 PM

## 2022-09-17 NOTE — Consult Note (Signed)
Neurology Consultation Reason for Consult: Stroke on MRI Requesting Physician: Thayer Jew  CC: Transient right arm numbness and weakness  History is obtained from:Patient, chart review  HPI: Mckinnon Glick is a 62 y.o. male with a past medical history significant for hypertension, hyperlipidemia, thoracic aortic mural thrombus, age-indeterminant splenic infarcts recently started on Eliquis, aspirin and statin, prolonged QTc, heart failure with reduced EF, alcohol abuse, vaping, gout  He had sudden onset right arm numbness and weakness starting at 1715 but symptoms have fully resolved at this time.  Too mild to treat on telespecialist evaluation, but sent to [facility] for further workup.  MRI brain did reveal very minor stroke for which neurology was consulted  Patient reports adherence to Eliquis 5 mg twice daily, aspirin 81 mg and other new medications started including atorvastatin 40 mg which was restarted.  He is cutting back the amount of nicotine in his vape pen, currently at the lowest nicotine level of 3%, and is also cutting back on drinking  He has no other acute complaints on review of symptoms, he was just discharged on 2/5 after presenting with abdominal pain, found to have splenic infarcts and a new diagnosis of heart failure with reduced ejection fraction.  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  ROS: All other review of systems was negative except as noted in the HPI.  Past Medical History:  Diagnosis Date   Gout    Hypercholesteremia    Hypertension    Past Surgical History:  Procedure Laterality Date   KNEE SURGERY     PROSTATECTOMY     Current Outpatient  Medications  Medication Instructions   aspirin EC 81 mg, Oral, Daily, Swallow whole.   atorvastatin (LIPITOR) 40 mg, Oral, Daily   Eliquis 5 mg, Oral, 2 times daily   febuxostat (ULORIC) 40 mg, Oral, Daily   Jardiance 10 mg, Oral, Daily   MAGNESIUM PO 1 tablet, Oral, Daily   metoprolol succinate (TOPROL-XL) 25 mg, Oral, Daily   potassium chloride (K-DUR,KLOR-CON) 10 MEQ tablet 20 mEq, Oral, Daily   sacubitril-valsartan (ENTRESTO) 24-26 MG 1 tablet, Oral, 2 times daily   spironolactone (ALDACTONE) 12.5 mg, Oral, Daily   VITAMIN D PO 1 tablet, Oral, Daily     History reviewed. No pertinent family history.   Social History:  reports that he has been smoking e-cigarettes. He has never used smokeless tobacco. He reports current alcohol use of about 4.0 standard drinks of alcohol per week. He reports that he does not use drugs.   Exam: Current vital signs: BP 112/76   Pulse 84   Temp 98.4 F (36.9 C)   Resp 16   Ht 5\' 11"  (1.803 m)   Wt 86 kg   SpO2 100%   BMI 26.44 kg/m  Vital signs in last 24 hours: Temp:  [97.9 F (36.6 C)-98.5 F (36.9 C)] 98.4 F (36.9 C) (02/07 0342) Pulse Rate:  [63-88] 84 (02/07 0342) Resp:  [16-21] 16 (02/07 0342) BP: (102-126)/(72-82) 112/76 (02/07 0342) SpO2:  [99 %-100 %] 100 % (02/07 0342) Weight:  [86 kg] 86 kg (02/06 2031)   Physical Exam  Constitutional: Appears well-developed and well-nourished.  Psych: Affect  appropriate to situation, pleasant and cooperative Eyes: No scleral injection HENT: No oropharyngeal obstruction.  MSK: no joint deformities.  Cardiovascular: Perfusing extremities well Respiratory: Effort normal, non-labored breathing GI: Soft.  No distension. There is no tenderness.  Skin: Warm dry and intact visible skin  Neuro: Mental Status: Patient is awake, alert, oriented to person, place, month, year, and situation. Patient is able to give a clear and coherent history. No signs of aphasia or neglect Cranial  Nerves: II: Visual Fields are full. Pupils are equal, round, and reactive to light.   III,IV, VI: EOMI without ptosis or diploplia.  Mildly saccadic pursuits V: Facial sensation is symmetric to temperature VII: Facial movement is symmetric.  VIII: hearing is intact to voice X: Uvula elevates symmetrically XI: Shoulder shrug is symmetric. XII: tongue is midline without atrophy or fasciculations.  Motor: Tone is normal. Bulk is normal. 5/5 strength was present in all four extremities.  No pronator drift Sensory: Sensation is symmetric to light touch and temperature in the arms and legs. Deep Tendon Reflexes: 2+ and symmetric in the brachioradialis and patellae.  Cerebellar: FNF and HKS are intact bilaterally Gait:  Casual gait normal when walking to bathroom  I have reviewed labs in epic and the results pertinent to this consultation are:  Basic Metabolic Panel: Recent Labs  Lab 09/12/22 2231 09/13/22 1229 09/14/22 0254 09/15/22 0211 09/16/22 2131  NA 136 134* 137 135 134*  K 3.7 2.9* 3.7 3.0* 3.4*  CL 101 102 106 100 102  CO2 23 21* 23 23 22   GLUCOSE 143* 103* 83 97 91  BUN 9 6 <5* 5* 7  CREATININE 0.78 0.82 0.95 1.02 1.13  CALCIUM 8.9 8.5* 8.4* 8.2* 9.1  MG  --  1.7  --  1.7  --     CBC: Recent Labs  Lab 09/12/22 2231 09/13/22 1229 09/14/22 0254 09/15/22 0211 09/16/22 2131  WBC 12.7* 9.5 7.2 7.9 10.7*  NEUTROABS  --  6.3  --   --  6.2  HGB 13.4 13.0 12.1* 11.3* 12.5*  HCT 40.3 38.0* 37.3* 34.4* 37.2*  MCV 88.0 88.8 92.1 91.2 89.2  PLT 567* 468* 365 344 402*    Coagulation Studies: Recent Labs    09/16/22 2131  LABPROT 14.7  INR 1.2    Lab Results  Component Value Date   HGBA1C 4.9 09/13/2022    No results found for: "CHOL", "HDL", "LDLCALC", "LDLDIRECT", "TRIG", "CHOLHDL"   I have reviewed the images obtained:  MRI brain personally reviewed, agree with radiology:   1. 1.5 cm acute ischemic nonhemorrhagic subcortical posterior left frontal  infarct, with a few scattered additional punctate nonhemorrhagic infarcts involving the bilateral parieto-occipital regions as above. These are likely embolic in nature. 2. Underlying mild age-related cerebral atrophy with chronic small vessel ischemic disease, with a small remote left basal ganglia lacunar infarct. 3. 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 Correlation with physical exam suggested.   CT head personally reviewed, agree with radiology:   1. Normal head CT. 2. Aspects is 10.  CTA head and neck personally reviewed, agree with radiology:   1. Negative CTA for large vessel occlusion or other emergent finding. 2. Atheromatous change about the carotid siphons with associated moderate multifocal narrowing. 3. Mild atheromatous change about the carotid bifurcations/proximal ICAs without hemodynamically significant greater than 50% stenosis.  CTA chest/abdomen/pelvis 2/3 1. No evidence for thoracic aortic aneurysm or dissection. There is some minimal calcified mural thrombus along the  medial wall of the lower thoracic aorta consistent with chronic process. 2. Enlargement of the pulmonary outflow tract/main pulmonary arteries suggests pulmonary arterial hypertension. 3. No evidence for central pulmonary embolus.  Impression: Multifocal embolic appearing strokes, suspect the same process that contributed to his splenic infarcts is also leading to these embolic events.  Based on imaging features I believe some of these events are older and newer, but certainly the largest (still very small) infarct correlates with the patient's symptoms of right hand weakness (which has resolved due to very small size of the stroke).  Due to the very small size of the strokes, and ongoing embolic events, risk of hemorrhagic conversion from continuing Eliquis is outweighed by risk of further embolic events from stopping Eliquis.  However, unclear primary indication  for Eliquis at this time, and query whether further workup would reveal that a different agent is indicated.  For example if he is having hypercoagulable state from malignancy with concern for iliac bone findings, would be helpful to consult oncology to determine if a different agent such as Lovenox would be preferable.  I do not think the previously mentioned calcified mural thrombus in the lower thoracic aorta is a significant source of emboli to the brain  Recommendations: -Risk/benefit analysis discussed with patient, continue anticoagulation at this time -Continue aspirin 81 mg -Continue atorvastatin 40 mg, goal LDL less than 70 -As patient is tolerating normotension without worsening symptoms and strokes are very small, goal normotension -No indication for PT/OT/SLP as patient is back to his baseline with no detectable neurological deficits -Recent echocardiogram completed, and as patient is already anticoagulated, echocardiogram would not change management.  If plan to stopping anticoagulation, consider TEE to definitively rule out intracardiac thrombus -Patient already planned for outpatient event monitor, could additionally consider loop recorder for increased sensitivity in embolic stroke of undetermined source -Consider oncology consultation to determine whether Eliquis or another agent is more appropriate, defer to medicine team -From a stroke risk perspective patient is otherwise medically optimized and neurology will be available as needed going forward  [place] [phone] Available 7 PM to 7 AM, outside of these hours please call Neurologist on call as listed on Amion.

## 2022-09-17 NOTE — ED Notes (Signed)
Patient rounds performed, patient expressed dissatisfaction about being in hall way bed for extended period of time.Offered to speak to charge nurse about obtaining a closed room for patient. Spoke to [employer] and was informed patient could be moved to room at 0700. Notified patient of same. Patient continued to express dissatisfaction about being in hall bed. Apologized about placement of patient in hallway, attempted to explain reason for hall beds.

## 2022-09-17 NOTE — Consult Note (Signed)
Albert King  [phone]   HEMATOLOGY ONCOLOGY INPATIENT CONSULTATION   Albert King  DOB: 1961/03/18  MR#: [id number]  CSN#: [id number]    Requesting Physician: [facility] Dr. Tamala Julian   Patient Care Team: Pcp, No as PCP - General  Reason for consult: anticoagulation for thrombosis   History of present illness:   62 year old gentleman with past medical history of hypertension, hyperlipidemia, who was admitted yesterday for acute ischemic stroke.  He presented with right-sided numbness and weakness last night, brain MRI revealed a 1.5 cm acute ischemic nonhemorrhagic subcortical posterior left frontal infarct, with a few scattered additional punctate nonhemorrhagic infarcts, most consistent with embolic stroke.  His CT angiogram was negative for large vessel occlusion.  Of note, patient was admitted to hospital last week for splenic infarct, he was started on Eliquis 5 mg twice daily, without loading dose.  He has been compliant with Eliquis.  This is being continued during this hospital admission.  I was consulted for his anticoagulation for recurrent thrombosis.  Patient also has history of prostate cancer, status post prostatectomy at the [facility] 3 years ago.  Per patient, his PSA has been undetectable.  He did not require additional treatment for prostate cancer after surgery.  MEDICAL HISTORY:  Past Medical History:  Diagnosis Date   Gout    Hypercholesteremia    Hypertension     SURGICAL HISTORY: Past Surgical History:  Procedure Laterality Date   KNEE SURGERY     PROSTATECTOMY      SOCIAL HISTORY: Social History   Socioeconomic History   Marital status: Married    Spouse name: Not on file   Number of children: Not on file   Years of education: Not on file   Highest education level: Not on file  Occupational History   Not on file  Tobacco Use   Smoking status: Every Day    Types: E-cigarettes   Smokeless tobacco: Never  Vaping  Use   Vaping Use: Every day  Substance and Sexual Activity   Alcohol use: Yes    Alcohol/week: 4.0 standard drinks of alcohol    Types: 4 Shots of liquor per week   Drug use: No   Sexual activity: Not on file  Other Topics Concern   Not on file  Social History Narrative   Not on file   Social Determinants of Health   Financial Resource Strain: Not on file  Food Insecurity: No Food Insecurity (09/17/2022)   Hunger Vital Sign    Worried About Running Out of Food in the Last Year: Never true    Ran Out of Food in the Last Year: Never true  Transportation Needs: No Transportation Needs (09/17/2022)   PRAPARE - Hydrologist (Medical): No    Lack of Transportation (Non-Medical): No  Physical Activity: Not on file  Stress: Not on file  Social Connections: Not on file  Intimate Partner Violence: Not At Risk (09/17/2022)   Humiliation, Afraid, Rape, and Kick questionnaire    Fear of Current or Ex-Partner: No    Emotionally Abused: No    Physically Abused: No    Sexually Abused: No    FAMILY HISTORY: History reviewed. No pertinent family history.  ALLERGIES:  is allergic to allopurinol.  MEDICATIONS:  Current Facility-Administered Medications  Medication Dose Route Frequency Provider Last Rate Last Admin   0.9 %  sodium chloride infusion   Intravenous Continuous Margie Billet, PA-C  acetaminophen (TYLENOL) tablet 650 mg  650 mg Oral Q6H PRN Norval Morton, MD       Or   acetaminophen (TYLENOL) suppository 650 mg  650 mg Rectal Q6H PRN Fuller Plan A, MD       albuterol (PROVENTIL) (2.5 MG/3ML) 0.083% nebulizer solution 2.5 mg  2.5 mg Nebulization Q6H PRN Norval Morton, MD       apixaban (ELIQUIS) tablet 5 mg  5 mg Oral BID Fuller Plan A, MD   5 mg at 09/17/22 [id number]   aspirin EC tablet 81 mg  81 mg Oral Daily Smith, Rondell A, MD       atorvastatin (LIPITOR) tablet 40 mg  40 mg Oral Daily Fuller Plan A, MD   40 mg at 09/17/22 0847    empagliflozin (JARDIANCE) tablet 10 mg  10 mg Oral Daily Fuller Plan A, MD   10 mg at 09/17/22 0848   febuxostat (ULORIC) tablet 40 mg  40 mg Oral Daily Fuller Plan A, MD   40 mg at 09/17/22 1451   metoprolol succinate (TOPROL-XL) 24 hr tablet 25 mg  25 mg Oral Daily Tamala Julian, Rondell A, MD   25 mg at 09/17/22 0847   [START ON 09/18/2022] potassium chloride SA (KLOR-CON M) CR tablet 20 mEq  20 mEq Oral Daily Fuller Plan A, MD       sacubitril-valsartan (ENTRESTO) 24-26 mg per tablet  1 tablet Oral BID Fuller Plan A, MD   1 tablet at 09/17/22 0847   sodium chloride flush (NS) 0.9 % injection 3 mL  3 mL Intravenous Q12H Smith, Rondell A, MD   3 mL at 09/17/22 0901   spironolactone (ALDACTONE) tablet 12.5 mg  12.5 mg Oral Daily Fuller Plan A, MD   12.5 mg at 09/17/22 0848   trimethobenzamide (TIGAN) injection 200 mg  200 mg Intramuscular Q6H PRN Norval Morton, MD        REVIEW OF SYSTEMS:   Constitutional: Denies fevers, chills or abnormal night sweats Eyes: Denies blurriness of vision, double vision or watery eyes Ears, nose, mouth, throat, and face: Denies mucositis or sore throat Respiratory: Denies cough, dyspnea or wheezes Cardiovascular: Denies palpitation, chest discomfort or lower extremity swelling Gastrointestinal:  Denies nausea, heartburn or change in bowel habits, he had upper abdominal pain last week secondary to splenic infarct, this is much improved lately. Skin: Denies abnormal skin rashes Lymphatics: Denies new lymphadenopathy or easy bruising Neurological:Denies numbness, tingling or new weaknesses Behavioral/Psych: Mood is stable, no new changes  All other systems were reviewed with the patient and are negative.  PHYSICAL EXAMINATION: ECOG PERFORMANCE STATUS: 1 - Symptomatic but completely ambulatory  Vitals:   09/17/22 1200 09/17/22 1540  BP: 119/78 135/80  Pulse: 77 76  Resp: (!) 21 18  Temp: 98.3 F (36.8 C) 98.2 F (36.8 C)  SpO2: 97% 100%   Filed  Weights   09/16/22 2031  Weight: 189 lb 9.5 oz (86 kg)    GENERAL:alert, no distress and comfortable SKIN: skin color, texture, turgor are normal, no rashes or significant lesions EYES: normal, conjunctiva are pink and non-injected, sclera clear OROPHARYNX:no exudate, no erythema and lips, buccal mucosa, and tongue normal  NECK: supple, thyroid normal size, non-tender, without nodularity LYMPH:  no palpable lymphadenopathy in the cervical, axillary or inguinal LUNGS: clear to auscultation and percussion with normal breathing effort HEART: regular rate & rhythm and no murmurs and no lower extremity edema ABDOMEN:abdomen soft, non-tender and normal bowel sounds Musculoskeletal:no cyanosis  of digits and no clubbing  PSYCH: alert & oriented x 3 with fluent speech NEURO: no focal motor/sensory deficits  LABORATORY DATA:  I have reviewed the data as listed Lab Results  Component Value Date   WBC 10.7 (H) 09/16/2022   HGB 12.5 (L) 09/16/2022   HCT 37.2 (L) 09/16/2022   MCV 89.2 09/16/2022   PLT 402 (H) 09/16/2022   Recent Labs    09/13/22 1229 09/14/22 0254 09/15/22 0211 09/16/22 2131  NA 134* 137 135 134*  K 2.9* 3.7 3.0* 3.4*  CL 102 106 100 102  CO2 21* 23 23 22   GLUCOSE 103* 83 97 91  BUN 6 <5* 5* 7  CREATININE 0.82 0.95 1.02 1.13  CALCIUM 8.5* 8.4* 8.2* 9.1  GFRNONAA >60 >60 >60 >60  PROT 6.2* 5.5*  --  6.8  ALBUMIN 3.0* 2.6*  --  3.5  AST 34 28  --  43*  ALT 20 18  --  26  ALKPHOS 143* 126  --  132*  BILITOT 2.2* 1.9*  --  0.7    RADIOGRAPHIC STUDIES: I have personally reviewed the radiological images as listed and agreed with the findings in the report. MR BRAIN WO CONTRAST  Result Date: 09/17/2022 CLINICAL DATA:  Initial evaluation for neuro deficit, stroke suspected. EXAM: MRI HEAD WITHOUT CONTRAST TECHNIQUE: Multiplanar, multiecho pulse sequences of the brain and surrounding structures were obtained without intravenous contrast. COMPARISON:  Comparison made  with prior CTs from 09/16/2022. FINDINGS: Brain: Mild age-related cerebral atrophy. Patchy T2/FLAIR hyperintensity involving the periventricular and deep white matter, consistent with chronic small vessel ischemic disease, mild in nature. Small remote lacunar infarct at the left basal ganglia. Approximate 1.5 cm focus of restricted diffusion involving the subcortical posterior left frontal low, consistent with a small acute ischemic infarct (series 5, image 97). Involvement of the precentral gyrus. Additional punctate subcentimeter cortical infarct noted at the contralateral right parietal lobe (series 5, image 91). Few additional probable punctate ischemic infarcts noted involving the cortical gray matter of the left frontoparietal region (series 5, image 88) as well as the right occipital lobe (series 5, images 84, 81). No associated hemorrhage or mass effect. These are likely embolic in nature. No other evidence for acute or subacute ischemia. Gray-white matter differentiation otherwise maintained. No other areas of chronic cortical infarction. No acute or chronic intracranial blood products. No mass lesion, midline shift or mass effect. No hydrocephalus or extra-axial fluid collection. Pituitary gland and suprasellar region within normal limits. Vascular: Major intracranial vascular flow voids are maintained. Skull and upper cervical spine: Craniocervical junction within normal limits. Bone marrow signal intensity normal. No scalp soft tissue abnormality. Sinuses/Orbits: Prior ocular lens replacement on the left. Few suspected polyps noted within the left nasal cavity, largest of which measures 1.8 cm (series 10, image 6). Mucosal thickening with a few small retention cysts versus polyps noted about the maxillary sinuses as well. No mastoid effusion. Other: None. IMPRESSION: 1. 1.5 cm acute ischemic nonhemorrhagic subcortical posterior left frontal infarct, with a few scattered additional punctate nonhemorrhagic  infarcts involving the bilateral parieto-occipital regions as above. These are likely embolic in nature. 2. Underlying mild age-related cerebral atrophy with chronic small vessel ischemic disease, with a small remote left basal ganglia lacunar infarct. 3. 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 Correlation with physical exam suggested. Electronically Signed   By: 11/15/2022 M.D.   On: 09/17/2022 03:30   CT ANGIO HEAD NECK W  WO CM  Result Date: 09/17/2022 CLINICAL DATA:  Initial evaluation for stroke. EXAM: CT ANGIOGRAPHY HEAD AND NECK TECHNIQUE: Multidetector CT imaging of the head and neck was performed using the standard protocol during bolus administration of intravenous contrast. Multiplanar CT image reconstructions and MIPs were obtained to evaluate the vascular anatomy. Carotid stenosis measurements (when applicable) are obtained utilizing NASCET criteria, using the distal internal carotid diameter as the denominator. RADIATION DOSE REDUCTION: This exam was performed according to the departmental dose-optimization program which includes automated exposure control, adjustment of the mA and/or kV according to patient size and/or use of iterative reconstruction technique. CONTRAST:  50mL OMNIPAQUE IOHEXOL 350 MG/ML SOLN COMPARISON:  Prior CT from earlier the same day. FINDINGS: CTA NECK FINDINGS Aortic arch: Visualized aortic arch normal caliber standard branch pattern. Mild aortic atherosclerosis. No stenosis about the origin the great vessels. Right carotid system: Right common and internal carotid arteries are patent without dissection. Mild eccentric soft plaque within the mid right CCA without significant stenosis. Mild calcified plaque about the right carotid bulb/proximal right ICA without hemodynamically significant greater than 50% stenosis. Left carotid system: Left common and internal carotid arteries are patent without dissection. Mild calcified plaque  about the left carotid bulb/proximal left ICA without hemodynamically significant greater than 50% stenosis. Vertebral arteries: Both vertebral arteries arise from the subclavian arteries. No proximal subclavian artery stenosis. Both vertebral arteries widely patent without stenosis, dissection or occlusion. Skeleton: No discrete or worrisome osseous lesions. Patient is edentulous. Other neck: No other acute soft tissue abnormality within the neck. Upper chest: Visualized upper chest demonstrates no acute finding. Review of the MIP images confirms the above findings CTA HEAD FINDINGS Anterior circulation: Atheromatous change within the carotid siphons with associated moderate multifocal narrowing. A1 segments, anterior communicating complex common anterior cerebral arteries widely patent. No M1 stenosis or occlusion. No proximal MCA branch occlusion or high-grade stenosis. Distal MCA branches perfused and symmetric. Posterior circulation: Both V4 segments patent without significant stenosis. Both PICA patent. Basilar patent without stenosis. Superior cerebellar and posterior cerebral arteries patent bilaterally. Venous sinuses: Grossly patent allowing for timing the contrast bolus. Anatomic variants: None significant.  No aneurysm. Review of the MIP images confirms the above findings IMPRESSION: 1. Negative CTA for large vessel occlusion or other emergent finding. 2. Atheromatous change about the carotid siphons with associated moderate multifocal narrowing. 3. Mild atheromatous change about the carotid bifurcations/proximal ICAs without hemodynamically significant greater than 50% stenosis. Aortic Atherosclerosis (ICD10-I70.0). Electronically Signed   By: Jeannine Boga M.D.   On: 09/17/2022 00:15   CT HEAD CODE STROKE WO CONTRAST  Result Date: 09/16/2022 CLINICAL DATA:  Code stroke. Neuro deficit. Acute. Stroke suspected. Right hand numbness. EXAM: CT HEAD WITHOUT CONTRAST TECHNIQUE: Contiguous axial  images were obtained from the base of the skull through the vertex without intravenous contrast. RADIATION DOSE REDUCTION: This exam was performed according to the departmental dose-optimization program which includes automated exposure control, adjustment of the mA and/or kV according to patient size and/or use of iterative reconstruction technique. COMPARISON:  None Available. FINDINGS: Brain: Normal appearance without evidence of old or acute infarction, mass lesion, hemorrhage, hydrocephalus or extra-axial collection. Vascular: There is atherosclerotic calcification of the major vessels at the base of the brain. Skull: Normal Sinuses/Orbits: Mild seasonal mucosal thickening.  Orbits negative. Other: None ASPECTS ([place] Stroke Program Early CT Score) - Ganglionic level infarction (caudate, lentiform nuclei, internal capsule, insula, M1-M3 cortex): 7 - Supraganglionic infarction (M4-M6 cortex): 3 Total score (0-10 with  10 being normal): 10 IMPRESSION: 1. Normal head CT. 2. Aspects is 10. These results were called by telephone at the time of interpretation on 09/16/2022 at 9:52 pm to provider Ernie AvenaJAMES LAWSING , who verbally acknowledged these results. Electronically Signed   By: Paulina FusiMark  Shogry M.D.   On: 09/16/2022 21:54   ECHOCARDIOGRAM COMPLETE  Result Date: 09/13/2022    ECHOCARDIOGRAM REPORT   Patient Name:   Katherine RoanDerrick Philley Date of Exam: 09/13/2022 Medical Rec #:  [id number]      Height:       71.0 in Accession #:    [id number]     Weight:       189.7 lb Date of Birth:  10/05/1960       BSA:          2.062 m Patient Age:    61 years       BP:           133/76 mmHg Patient Gender: M              HR:           55 bpm. Exam Location:  Inpatient Procedure: 2D Echo, Cardiac Doppler and Color Doppler Indications:    Abnormal ECG                 Splenic infarct  History:        Patient has no prior history of Echocardiogram examinations.                 Risk Factors:Hypertension and Dyslipidemia.  Sonographer:    Ross LudwigArthur Guy  RDCS (AE) Referring Phys: Arn MedalONDELL A SMITH IMPRESSIONS  1. Left ventricular ejection fraction, by estimation, is 35 to 40%. The left ventricle has moderately decreased function. The left ventricle demonstrates global hypokinesis. Left ventricular diastolic parameters are consistent with Grade I diastolic dysfunction (impaired relaxation). There is incoordinate septal motion.  2. Right ventricular systolic function is normal. The right ventricular size is normal. Tricuspid regurgitation signal is inadequate for assessing PA pressure.  3. The mitral valve is abnormal. Trivial mitral valve regurgitation.  4. The aortic valve is tricuspid. Aortic valve regurgitation is not visualized.  5. The inferior vena cava is dilated in size with >50% respiratory variability, suggesting right atrial pressure of 8 mmHg. Comparison(s): No prior Echocardiogram. FINDINGS  Left Ventricle: Left ventricular ejection fraction, by estimation, is 35 to 40%. The left ventricle has moderately decreased function. The left ventricle demonstrates global hypokinesis. The left ventricular internal cavity size was normal in size. There is no left ventricular hypertrophy. Incoordinate septal motion. Left ventricular diastolic parameters are consistent with Grade I diastolic dysfunction (impaired relaxation). Indeterminate filling pressures. Right Ventricle: The right ventricular size is normal. No increase in right ventricular wall thickness. Right ventricular systolic function is normal. Tricuspid regurgitation signal is inadequate for assessing PA pressure. Left Atrium: Left atrial size was normal in size. Right Atrium: Right atrial size was normal in size. Pericardium: There is no evidence of pericardial effusion. Mitral Valve: The mitral valve is abnormal. There is mild thickening of the anterior and posterior mitral valve leaflet(s). Trivial mitral valve regurgitation. Tricuspid Valve: The tricuspid valve is grossly normal. Tricuspid valve  regurgitation is not demonstrated. Aortic Valve: The aortic valve is tricuspid. Aortic valve regurgitation is not visualized. Aortic valve mean gradient measures 3.0 mmHg. Aortic valve peak gradient measures 5.6 mmHg. Aortic valve area, by VTI measures 2.96 cm. Pulmonic Valve: The pulmonic valve was normal in structure. Pulmonic valve  regurgitation is not visualized. Aorta: The aortic root and ascending aorta are structurally normal, with no evidence of dilitation. Venous: The inferior vena cava is dilated in size with greater than 50% respiratory variability, suggesting right atrial pressure of 8 mmHg. IAS/Shunts: No atrial level shunt detected by color flow Doppler.  LEFT VENTRICLE PLAX 2D LVIDd:         5.20 cm     Diastology LVIDs:         3.30 cm     LV e' medial:    6.85 cm/s LV PW:         1.00 cm     LV E/e' medial:  8.7 LV IVS:        1.00 cm     LV e' lateral:   5.00 cm/s LVOT diam:     2.40 cm     LV E/e' lateral: 11.9 LV SV:         74 LV SV Index:   36 LVOT Area:     4.52 cm  LV Volumes (MOD) LV vol d, MOD A2C: 88.2 ml LV vol d, MOD A4C: 90.3 ml LV vol s, MOD A2C: 53.6 ml LV vol s, MOD A4C: 64.0 ml LV SV MOD A2C:     34.6 ml LV SV MOD A4C:     90.3 ml LV SV MOD BP:      30.1 ml RIGHT VENTRICLE          IVC RV Basal diam:  2.60 cm  IVC diam: 2.20 cm TAPSE (M-mode): 2.2 cm LEFT ATRIUM           Index        RIGHT ATRIUM           Index LA diam:      3.60 cm 1.75 cm/m   RA Area:     15.20 cm LA Vol (A2C): 36.8 ml 17.85 ml/m  RA Volume:   39.10 ml  18.96 ml/m LA Vol (A4C): 58.9 ml 28.57 ml/m  AORTIC VALVE AV Area (Vmax):    3.24 cm AV Area (Vmean):   3.04 cm AV Area (VTI):     2.96 cm AV Vmax:           118.00 cm/s AV Vmean:          75.500 cm/s AV VTI:            0.251 m AV Peak Grad:      5.6 mmHg AV Mean Grad:      3.0 mmHg LVOT Vmax:         84.40 cm/s LVOT Vmean:        50.800 cm/s LVOT VTI:          0.164 m LVOT/AV VTI ratio: 0.65  AORTA Ao Root diam: 3.30 cm Ao Asc diam:  3.30 cm MITRAL  VALVE MV Area (PHT): 2.86 cm    SHUNTS MV Decel Time: 265 msec    Systemic VTI:  0.16 m MV E velocity: 59.70 cm/s  Systemic Diam: 2.40 cm MV A velocity: 61.80 cm/s MV E/A ratio:  0.97 Zoila ShutterKenneth Hilty MD Electronically signed by Zoila ShutterKenneth Hilty MD Signature Date/Time: 09/13/2022/3:27:57 PM    Final    CT Angio Chest Aorta W and/or Wo Contrast  Result Date: 09/13/2022 CLINICAL DATA:  Acute aortic syndrome suspected. Splenic infarct on abdominal CT earlier same day EXAM: CT ANGIOGRAPHY CHEST WITH CONTRAST TECHNIQUE: Multidetector CT imaging of the chest was performed using the standard protocol  during bolus administration of intravenous contrast. Multiplanar CT image reconstructions and MIPs were obtained to evaluate the vascular anatomy. RADIATION DOSE REDUCTION: This exam was performed according to the departmental dose-optimization program which includes automated exposure control, adjustment of the mA and/or kV according to patient size and/or use of iterative reconstruction technique. CONTRAST:  168mL OMNIPAQUE IOHEXOL 350 MG/ML SOLN COMPARISON:  CT abdomen/pelvis from earlier same day FINDINGS: Cardiovascular: Ascending thoracic aorta measures 3.3 cm diameter. Descending thoracic aorta is 2.6 cm diameter. Mild atherosclerotic calcification is noted in the wall of the thoracic aorta. No evidence for dissection flap within the thoracic aorta. Aortic arch vessel branch anatomy is widely patent. There is some minimal calcified mural thrombus along the medial wall of the lower thoracic aorta consistent with chronic process (axial 108/4). Enlargement of the pulmonary outflow tract/main pulmonary arteries suggests pulmonary arterial hypertension. No evidence for central pulmonary embolus in the main pulmonary arteries, lobar pulmonary arteries, or segmental pulmonary arteries. Imaging on the study was carried down into the mid abdomen and the celiac axis and SMA are widely patent. Splenic artery appears widely patent  without appreciable atherosclerotic disease, filling defect or aneurysm. Mediastinum/Nodes: No mediastinal lymphadenopathy. There is no hilar lymphadenopathy. The esophagus has normal imaging features. There is no axillary lymphadenopathy. Lungs/Pleura: No focal airspace consolidation. No suspicious pulmonary nodule or mass. No pulmonary edema or pleural effusion. Upper Abdomen: Deferred to dedicated abdomen and pelvis CT exam performed immediately prior to this study. Musculoskeletal: No worrisome lytic or sclerotic osseous abnormality. Review of the MIP images confirms the above findings. IMPRESSION: 1. No evidence for thoracic aortic aneurysm or dissection. There is some minimal calcified mural thrombus along the medial wall of the lower thoracic aorta consistent with chronic process. 2. Enlargement of the pulmonary outflow tract/main pulmonary arteries suggests pulmonary arterial hypertension. 3. No evidence for central pulmonary embolus. Electronically Signed   By: Misty Stanley M.D.   On: 09/13/2022 05:18   CT Abdomen Pelvis W Contrast  Result Date: 09/13/2022 CLINICAL DATA:  Left lower quadrant abdominal pain. EXAM: CT ABDOMEN AND PELVIS WITH CONTRAST TECHNIQUE: Multidetector CT imaging of the abdomen and pelvis was performed using the standard protocol following bolus administration of intravenous contrast. RADIATION DOSE REDUCTION: This exam was performed according to the departmental dose-optimization program which includes automated exposure control, adjustment of the mA and/or kV according to patient size and/or use of iterative reconstruction technique. CONTRAST:  163mL OMNIPAQUE IOHEXOL 300 MG/ML  SOLN COMPARISON:  06/05/2009. FINDINGS: Lower chest: No acute abnormality. Hepatobiliary: No focal liver abnormality is seen. Fatty infiltration of the liver is noted. No gallstones, gallbladder wall thickening, or biliary dilatation. Pancreas: Unremarkable. No pancreatic ductal dilatation or surrounding  inflammatory changes. Spleen: Multifocal hypodense regions are present in the spleen, suspicious for infarcts. No subcapsular hematoma is seen. Adrenals/Urinary Tract: The adrenal glands are within normal limits. Kidneys enhance symmetrically. No renal calculus or hydronephrosis. Bladder is unremarkable. Stomach/Bowel: Stomach is within normal limits. Appendix appears normal. No evidence of bowel wall thickening, distention, or inflammatory changes. No free air or pneumatosis. Vascular/Lymphatic: Aortic atherosclerosis. No enlarged abdominal or pelvic lymph nodes. Reproductive: The prostate gland is not seen. Other: No abdominopelvic ascites. Musculoskeletal: Trabecular thickening and sclerosis are noted in the iliac bone on the left, suggesting Paget's disease. Mild degenerative changes in the thoracolumbar spine. No acute osseous abnormality IMPRESSION: 1. Regional hypodensities in the spleen, suggesting infarcts and indeterminate in age. 2. Hepatic steatosis. 3. 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 Electronically Signed   By: Brett Fairy M.D.   On: 09/13/2022 02:30    ASSESSMENT & PLAN:  62 yo male with PMH of hypertension, dyslipidemia, and recent splenic infarct last week, was admitted last night for acute ischemic stroke, probably embolic stroke.   Acute ischemic CVA, likely embolic Recent history of splenic infarct, on Eliquis to 5 mg twice daily Congestive heart failure with EF 35 to 40% Hypertension Dyslipidemia Abnormal left iliac bone lesion, suggesting Paget's disease  History of prostate cancer, status post prostatectomy Heavy alcohol drinker  Recommendations: -I have reviewed his lab and multiple images, including CT from 09/13/22 and brain MRI  -both his splenic infarct and stroke are likely embolic events, but the source of thrombosis is unclear, TTE was negative for thrombosis, TEE is  pending. -He had a CT chest, abdomen pelvis last week, no evidence of malignancy.  Per radiology, his left iliac bone lesion is likely Paget's disease.  I will repeat his PSA tomorrow, I have low suspicion that this is metastatic prostate cancer.  If PSA normal, no additional workup needed. -He has been no lab evidence of MPN, he had a mild thrombocytosis last week, that has resolved.  No splenomegaly on recent images. -I agree with anticoagulation and antiplatelet therapy with aspirin.  At this point, I do not see any difference if we use DOAC, warfarin or lovenox.  -I think the benefit from hypercoagulopathy workup, to rule out antiphospholipid syndrome.  However I would recommend the workup to be done in 3-6 months after he comes off anticoagulation. He would like to f/u with his [place] oncologist. If he has antiphospholipid syndrome, warfarin would be a better option. -thanks for the consult.   All questions were answered. The patient knows to call the clinic with any problems, questions or concerns.      Truitt Merle, MD 09/17/2022

## 2022-09-17 NOTE — ED Notes (Signed)
ED TO INPATIENT HANDOFF REPORT  ED Nurse Name and Phone #: [phone]  S Name/Age/Gender Albert King 62 y.o. male Room/Bed: 036C/036C  Code Status   Code Status: Full Code  Home/SNF/Other Home Patient oriented to: self, place, time, and situation Is this baseline? Yes   Triage Complete: Triage complete  Chief Complaint Acute CVA (cerebrovascular accident) [facility]) [I63.9]  Triage Note Patient here POV from Home.  Endorses Right Hand Numbness that began at 1915 today. Intermittent in nature. No Pain.   NAD Noted during Triage. A&Ox4. GCS 15. Ambulatory.    Allergies Allergies  Allergen Reactions   Allopurinol Itching and Nausea And Vomiting    Level of Care/Admitting Diagnosis ED Disposition     ED Disposition  Admit   Condition  --   Comment  Hospital Area: [place] [[id number]]  Level of Care: Telemetry Medical [104]  May admit patient to [facility] or [facility] if equivalent level of care is available:: No  Covid Evaluation: Asymptomatic - no recent exposure (last 10 days) testing not required  [facility]: [id number]  Admitting Physician: Christel Mormon [id number]  Attending Physician: Christel Mormon [id number]  Certification:: I certify this patient will need inpatient services for at least 2 midnights  Estimated Length of Stay: 2          B Medical/Surgery History Past Medical History:  Diagnosis Date   Gout    Hypercholesteremia    Hypertension    Past Surgical History:  Procedure Laterality Date   KNEE SURGERY     PROSTATECTOMY       A IV Location/Drains/Wounds Patient Lines/Drains/Airways Status     Active Line/Drains/Airways     Name Placement date Placement time Site Days   Peripheral IV 09/16/22 18 G 1.16" Anterior;Left;Upper Arm 09/16/22  2137  Arm  1   Peripheral IV 09/17/22 20 G Anterior;Right Arm 09/17/22  0840  Arm  less than 1            Intake/Output Last 24  hours No intake or output data in the 24 hours ending 09/17/22 1436  Labs/Imaging Results for orders placed or performed during the hospital encounter of 09/16/22 (from the past 48 hour(s))  Ethanol     Status: None   Collection Time: 09/16/22  9:31 PM  Result Value Ref Range   Alcohol, Ethyl (B) <10 <10 mg/dL    Comment: (NOTE) Lowest detectable limit for serum alcohol is 10 mg/dL.  For medical purposes only. Performed at [facility], [address] [place], [place] [address]   Protime-INR     Status: None   Collection Time: 09/16/22  9:31 PM  Result Value Ref Range   Prothrombin Time 14.7 11.4 - 15.2 seconds   INR 1.2 0.8 - 1.2    Comment: (NOTE) INR goal varies based on device and disease states. Performed at [facility], [address], [place], [place] [address]   APTT     Status: None   Collection Time: 09/16/22  9:31 PM  Result Value Ref Range   aPTT 30 24 - 36 seconds    Comment: Performed at [facility], [address] [place], [place] [address]  CBC     Status: Abnormal   Collection Time: 09/16/22  9:31 PM  Result Value Ref Range   WBC 10.7 (H) 4.0 - 10.5 K/uL   RBC 4.17 (L) 4.22 - 5.81 MIL/uL   Hemoglobin 12.5 (L) 13.0 -  17.0 g/dL   HCT 37.2 (L) 39.0 - 52.0 %   MCV 89.2 80.0 - 100.0 fL   MCH 30.0 26.0 - 34.0 pg   MCHC 33.6 30.0 - 36.0 g/dL   RDW 16.5 (H) 11.5 - 15.5 %   Platelets 402 (H) 150 - 400 K/uL   nRBC 0.0 0.0 - 0.2 %    Comment: Performed at [facility], [address], [place], [place] [address]  Differential     Status: Abnormal   Collection Time: 09/16/22  9:31 PM  Result Value Ref Range   Neutrophils Relative % 58 %   Neutro Abs 6.2 1.7 - 7.7 K/uL   Lymphocytes Relative 21 %   Lymphs Abs 2.3 0.7 - 4.0 K/uL   Monocytes Relative 16 %   Monocytes Absolute 1.7 (H) 0.1 - 1.0 K/uL   Eosinophils Relative 1 %   Eosinophils Absolute 0.1 0.0 - 0.5 K/uL   Basophils  Relative 1 %   Basophils Absolute 0.1 0.0 - 0.1 K/uL   Immature Granulocytes 3 %   Abs Immature Granulocytes 0.33 (H) 0.00 - 0.07 K/uL    Comment: Performed at [facility], [address], [place], [place] [address]  Comprehensive metabolic panel     Status: Abnormal   Collection Time: 09/16/22  9:31 PM  Result Value Ref Range   Sodium 134 (L) 135 - 145 mmol/L   Potassium 3.4 (L) 3.5 - 5.1 mmol/L   Chloride 102 98 - 111 mmol/L   CO2 22 22 - 32 mmol/L   Glucose, Bld 91 70 - 99 mg/dL    Comment: Glucose reference range applies only to samples taken after fasting for at least 8 hours.   BUN 7 6 - 20 mg/dL   Creatinine, Ser 1.13 0.61 - 1.24 mg/dL   Calcium 9.1 8.9 - 10.3 mg/dL   Total Protein 6.8 6.5 - 8.1 g/dL   Albumin 3.5 3.5 - 5.0 g/dL   AST 43 (H) 15 - 41 U/L   ALT 26 0 - 44 U/L   Alkaline Phosphatase 132 (H) 38 - 126 U/L   Total Bilirubin 0.7 0.3 - 1.2 mg/dL   GFR, Estimated >60 >60 mL/min    Comment: (NOTE) Calculated using the CKD-EPI Creatinine Equation (2021)    Anion gap 10 5 - 15    Comment: Performed at [facility], [address] [place], [place] [address]  CBG monitoring, ED     Status: None   Collection Time: 09/16/22  9:35 PM  Result Value Ref Range   Glucose-Capillary 98 70 - 99 mg/dL    Comment: Glucose reference range applies only to samples taken after fasting for at least 8 hours.  Urine rapid drug screen (hosp performed)     Status: None   Collection Time: 09/16/22 10:25 PM  Result Value Ref Range   Opiates NONE DETECTED NONE DETECTED   Cocaine NONE DETECTED NONE DETECTED   Benzodiazepines NONE DETECTED NONE DETECTED   Amphetamines NONE DETECTED NONE DETECTED   Tetrahydrocannabinol NONE DETECTED NONE DETECTED   Barbiturates NONE DETECTED NONE DETECTED    Comment: (NOTE) DRUG SCREEN FOR MEDICAL PURPOSES ONLY.  IF CONFIRMATION IS NEEDED FOR ANY PURPOSE, NOTIFY LAB WITHIN 5 DAYS.  LOWEST DETECTABLE LIMITS FOR  URINE DRUG SCREEN Drug Class                     Cutoff (ng/mL) Amphetamine and metabolites    1000 Barbiturate and metabolites  200 Benzodiazepine                 200 Opiates and metabolites        300 Cocaine and metabolites        300 THC                            50 Performed at [facility], [address] [place], [place] [address]   Urinalysis, Routine w reflex microscopic -Urine, Clean Catch     Status: Abnormal   Collection Time: 09/16/22 10:25 PM  Result Value Ref Range   Color, Urine YELLOW YELLOW   APPearance CLEAR CLEAR   Specific Gravity, Urine 1.007 1.005 - 1.030   pH 6.0 5.0 - 8.0   Glucose, UA 500 (A) NEGATIVE mg/dL   Hgb urine dipstick NEGATIVE NEGATIVE   Bilirubin Urine NEGATIVE NEGATIVE   Ketones, ur NEGATIVE NEGATIVE mg/dL   Protein, ur NEGATIVE NEGATIVE mg/dL   Nitrite NEGATIVE NEGATIVE   Leukocytes,Ua NEGATIVE NEGATIVE   RBC / HPF 0-5 0 - 5 RBC/hpf   WBC, UA 0-5 0 - 5 WBC/hpf   Bacteria, UA NONE SEEN NONE SEEN   Squamous Epithelial / HPF 0-5 0 - 5 /HPF    Comment: Performed at [facility], [address], [place], [place] [address]  Lipid panel     Status: Abnormal   Collection Time: 09/17/22  8:24 AM  Result Value Ref Range   Cholesterol 196 0 - 200 mg/dL   Triglycerides 124 <150 mg/dL   HDL 56 >40 mg/dL   Total CHOL/HDL Ratio 3.5 RATIO   VLDL 25 0 - 40 mg/dL   LDL Cholesterol 115 (H) 0 - 99 mg/dL    Comment:        Total Cholesterol/HDL:CHD Risk Coronary Heart Disease Risk Table                     Men   Women  1/2 Average Risk   3.4   3.3  Average Risk       5.0   4.4  2 X Average Risk   9.6   7.1  3 X Average Risk  23.4   11.0        Use the calculated Patient Ratio above and the CHD Risk Table to determine the patient's CHD Risk.        ATP III CLASSIFICATION (LDL):  <100     mg/dL   Optimal  100-129  mg/dL   Near or Above                    Optimal  130-159  mg/dL   Borderline  160-189   mg/dL   High  >190     mg/dL   Very High Performed at [facility] [address] [place], [place] [address]    MR BRAIN WO CONTRAST  Result Date: 09/17/2022 CLINICAL DATA:  Initial evaluation for neuro deficit, stroke suspected. EXAM: MRI HEAD WITHOUT CONTRAST TECHNIQUE: Multiplanar, multiecho pulse sequences of the brain and surrounding structures were obtained without intravenous contrast. COMPARISON:  Comparison made with prior CTs from 09/16/2022. FINDINGS: Brain: Mild age-related cerebral atrophy. Patchy T2/FLAIR hyperintensity involving the periventricular and deep white matter, consistent with chronic small vessel ischemic disease, mild in nature. Small remote lacunar infarct at the left basal ganglia. Approximate 1.5 cm focus of restricted diffusion involving the subcortical posterior  left frontal low, consistent with a small acute ischemic infarct (series 5, image 97). Involvement of the precentral gyrus. Additional punctate subcentimeter cortical infarct noted at the contralateral right parietal lobe (series 5, image 91). Few additional probable punctate ischemic infarcts noted involving the cortical gray matter of the left frontoparietal region (series 5, image 88) as well as the right occipital lobe (series 5, images 84, 81). No associated hemorrhage or mass effect. These are likely embolic in nature. No other evidence for acute or subacute ischemia. Gray-white matter differentiation otherwise maintained. No other areas of chronic cortical infarction. No acute or chronic intracranial blood products. No mass lesion, midline shift or mass effect. No hydrocephalus or extra-axial fluid collection. Pituitary gland and suprasellar region within normal limits. Vascular: Major intracranial vascular flow voids are maintained. Skull and upper cervical spine: Craniocervical junction within normal limits. Bone marrow signal intensity normal. No scalp soft tissue abnormality. Sinuses/Orbits: Prior  ocular lens replacement on the left. Few suspected polyps noted within the left nasal cavity, largest of which measures 1.8 cm (series 10, image 6). Mucosal thickening with a few small retention cysts versus polyps noted about the maxillary sinuses as well. No mastoid effusion. Other: None. IMPRESSION: 1. 1.5 cm acute ischemic nonhemorrhagic subcortical posterior left frontal infarct, with a few scattered additional punctate nonhemorrhagic infarcts involving the bilateral parieto-occipital regions as above. These are likely embolic in nature. 2. Underlying mild age-related cerebral atrophy with chronic small vessel ischemic disease, with a small remote left basal ganglia lacunar infarct. 3. 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 Correlation with physical exam suggested. Electronically Signed   By: Rise Mu M.D.   On: 09/17/2022 03:30   CT ANGIO HEAD NECK W WO CM  Result Date: 09/17/2022 CLINICAL DATA:  Initial evaluation for stroke. EXAM: CT ANGIOGRAPHY HEAD AND NECK TECHNIQUE: Multidetector CT imaging of the head and neck was performed using the standard protocol during bolus administration of intravenous contrast. Multiplanar CT image reconstructions and MIPs were obtained to evaluate the vascular anatomy. Carotid stenosis measurements (when applicable) are obtained utilizing NASCET criteria, using the distal internal carotid diameter as the denominator. RADIATION DOSE REDUCTION: This exam was performed according to the departmental dose-optimization program which includes automated exposure control, adjustment of the mA and/or kV according to patient size and/or use of iterative reconstruction technique. CONTRAST:  31mL OMNIPAQUE IOHEXOL 350 MG/ML SOLN COMPARISON:  Prior CT from earlier the same day. FINDINGS: CTA NECK FINDINGS Aortic arch: Visualized aortic arch normal caliber standard branch pattern. Mild aortic atherosclerosis. No stenosis about the origin the  great vessels. Right carotid system: Right common and internal carotid arteries are patent without dissection. Mild eccentric soft plaque within the mid right CCA without significant stenosis. Mild calcified plaque about the right carotid bulb/proximal right ICA without hemodynamically significant greater than 50% stenosis. Left carotid system: Left common and internal carotid arteries are patent without dissection. Mild calcified plaque about the left carotid bulb/proximal left ICA without hemodynamically significant greater than 50% stenosis. Vertebral arteries: Both vertebral arteries arise from the subclavian arteries. No proximal subclavian artery stenosis. Both vertebral arteries widely patent without stenosis, dissection or occlusion. Skeleton: No discrete or worrisome osseous lesions. Patient is edentulous. Other neck: No other acute soft tissue abnormality within the neck. Upper chest: Visualized upper chest demonstrates no acute finding. Review of the MIP images confirms the above findings CTA HEAD FINDINGS Anterior circulation: Atheromatous change within the carotid siphons with associated moderate multifocal  narrowing. A1 segments, anterior communicating complex common anterior cerebral arteries widely patent. No M1 stenosis or occlusion. No proximal MCA branch occlusion or high-grade stenosis. Distal MCA branches perfused and symmetric. Posterior circulation: Both V4 segments patent without significant stenosis. Both PICA patent. Basilar patent without stenosis. Superior cerebellar and posterior cerebral arteries patent bilaterally. Venous sinuses: Grossly patent allowing for timing the contrast bolus. Anatomic variants: None significant.  No aneurysm. Review of the MIP images confirms the above findings IMPRESSION: 1. Negative CTA for large vessel occlusion or other emergent finding. 2. Atheromatous change about the carotid siphons with associated moderate multifocal narrowing. 3. Mild atheromatous  change about the carotid bifurcations/proximal ICAs without hemodynamically significant greater than 50% stenosis. Aortic Atherosclerosis (ICD10-I70.0). Electronically Signed   By: Jeannine Boga M.D.   On: 09/17/2022 00:15   CT HEAD CODE STROKE WO CONTRAST  Result Date: 09/16/2022 CLINICAL DATA:  Code stroke. Neuro deficit. Acute. Stroke suspected. Right hand numbness. EXAM: CT HEAD WITHOUT CONTRAST TECHNIQUE: Contiguous axial images were obtained from the base of the skull through the vertex without intravenous contrast. RADIATION DOSE REDUCTION: This exam was performed according to the departmental dose-optimization program which includes automated exposure control, adjustment of the mA and/or kV according to patient size and/or use of iterative reconstruction technique. COMPARISON:  None Available. FINDINGS: Brain: Normal appearance without evidence of old or acute infarction, mass lesion, hemorrhage, hydrocephalus or extra-axial collection. Vascular: There is atherosclerotic calcification of the major vessels at the base of the brain. Skull: Normal Sinuses/Orbits: Mild seasonal mucosal thickening.  Orbits negative. Other: None ASPECTS ([place] Stroke Program Early CT Score) - Ganglionic level infarction (caudate, lentiform nuclei, internal capsule, insula, M1-M3 cortex): 7 - Supraganglionic infarction (M4-M6 cortex): 3 Total score (0-10 with 10 being normal): 10 IMPRESSION: 1. Normal head CT. 2. Aspects is 10. These results were called by telephone at the time of interpretation on 09/16/2022 at 9:52 pm to provider Regan Lemming , who verbally acknowledged these results. Electronically Signed   By: Nelson Chimes M.D.   On: 09/16/2022 21:54    Pending Labs Unresulted Labs (From admission, onward)     Start     Ordered   09/18/22 0500  CBC  Tomorrow morning,   R        09/17/22 0824   09/18/22 3536  Basic metabolic panel  Tomorrow morning,   R        09/17/22 0824   09/18/22 0500  Magnesium   Tomorrow morning,   R        09/17/22 0910            Vitals/Pain Today's Vitals   09/17/22 1115 09/17/22 1130 09/17/22 1145 09/17/22 1200  BP:    119/78  Pulse: 76 71 65 77  Resp: (!) 26 20 16  (!) 21  Temp:    98.3 F (36.8 C)  TempSrc:    Oral  SpO2: 98% 96% 99% 97%  Weight:      Height:      PainSc:        Isolation Precautions No active isolations  Medications Medications  aspirin EC tablet 81 mg (81 mg Oral Not Given 09/17/22 0851)  atorvastatin (LIPITOR) tablet 40 mg (40 mg Oral Given 09/17/22 0847)  sodium chloride flush (NS) 0.9 % injection 3 mL (3 mLs Intravenous Given 09/17/22 0901)  acetaminophen (TYLENOL) tablet 650 mg (has no administration in time range)    Or  acetaminophen (TYLENOL) suppository 650 mg (has no administration in  time range)  albuterol (PROVENTIL) (2.5 MG/3ML) 0.083% nebulizer solution 2.5 mg (has no administration in time range)  trimethobenzamide (TIGAN) injection 200 mg (has no administration in time range)  empagliflozin (JARDIANCE) tablet 10 mg (10 mg Oral Given 09/17/22 0848)  spironolactone (ALDACTONE) tablet 12.5 mg (12.5 mg Oral Given 09/17/22 0848)  sacubitril-valsartan (ENTRESTO) 24-26 mg per tablet (1 tablet Oral Given 09/17/22 0847)  metoprolol succinate (TOPROL-XL) 24 hr tablet 25 mg (25 mg Oral Given 09/17/22 0847)  apixaban (ELIQUIS) tablet 5 mg (5 mg Oral Given 09/17/22 0851)  febuxostat (ULORIC) tablet 40 mg (has no administration in time range)  potassium chloride SA (KLOR-CON M) CR tablet 20 mEq (has no administration in time range)  iohexol (OMNIPAQUE) 350 MG/ML injection 100 mL (75 mLs Intravenous Contrast Given 09/16/22 2340)  magnesium sulfate IVPB 2 g 50 mL (0 g Intravenous Stopped 09/17/22 1126)  potassium chloride SA (KLOR-CON M) CR tablet 40 mEq (40 mEq Oral Given 09/17/22 1022)    Mobility walks     Focused Assessments Neuro Assessment Handoff:  Swallow screen pass? Yes  Cardiac Rhythm: Normal sinus rhythm NIH Stroke Scale   Dizziness Present: No Headache Present: No Interval: Shift assessment Level of Consciousness (1a.)   : Alert, keenly responsive LOC Questions (1b. )   : Answers both questions correctly LOC Commands (1c. )   : Performs both tasks correctly Best Gaze (2. )  : Normal Visual (3. )  : No visual loss Facial Palsy (4. )    : Normal symmetrical movements Motor Arm, Left (5a. )   : No drift Motor Arm, Right (5b. ) : No drift Motor Leg, Left (6a. )  : No drift Motor Leg, Right (6b. ) : No drift Limb Ataxia (7. ): Absent Sensory (8. )  : Normal, no sensory loss Best Language (9. )  : No aphasia Dysarthria (10. ): Normal Extinction/Inattention (11.)   : No Abnormality Complete NIHSS TOTAL: 0 Last date known well: 09/16/22 Last time known well: 1915 Neuro Assessment: Exceptions to WDL Neuro Checks:   Initial (09/16/22 2030)  Has TPA been given? No If patient is a Neuro Trauma and patient is going to OR before floor call report to [facility] nurse: [phone] or [phone]   R Recommendations: See Admitting Provider Note  Report given to:   Additional Notes:  Had a stroke . pt get neuro check every 4 hours. He ambulate to the bathroom . A & 0 x4. Pt need to be NPO for a TEE tomorrow at 12 noon. He got potassium 40 mg level was 3.4, normally take potassium at home, he also receive 2g magnesium  today. On 2/5 magnesium was 1.7

## 2022-09-17 NOTE — ED Notes (Signed)
Report given to karen RN at [facility] [place]

## 2022-09-17 NOTE — H&P (Signed)
History and Physical    Patient: Albert King JXB:[id number] DOB: 1960-11-20 DOA: 09/16/2022 DOS: the patient was seen and examined on 09/17/2022 PCP: Pcp, No  Patient coming from: [facility]  Chief Complaint:  Chief Complaint  Patient presents with   Numbness   HPI: Albert King is a 62 y.o. right-handed male with medical history significant of hypertension, hyperlipidemia, gout, who presented with a right and numbness and weakness that began yesterday at 7:15 PM.  He had been making beef patties at the time when he first noticed the symptoms.  He said that his right hand went numb and his wrist drop.  Patient was unable to get good grip strength to hold his thermos cup.  Since that time he states that the strength in his hand has returned back to baseline and numbness also slowly resolved.  Denied having any slurred speech, change in vision, facial droop, palpitations, chest pain, or focal weakness in any other extremity.   Patient had just recently been hospitalized from 2/3-2/5 and presented with complaints of abdominal pain with nausea and vomiting.  Found to have concern for possible splenic artery with newly reduced EF of 35-40% with grade 1 diastolic dysfunction and global hypokinesis.  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  Workup including TSH and ANA negative.  Recommended patient to follow-up in outpatient setting for workup with cardiology including stress test or coronary CT. Since getting home patient has been taking all of the medications as prescribed and have plans to follow-up with the [facility] sometime next week  At [place] patient was noted to be afebrile with labs relatively maintained.  Initial CT scan of the head was negative.  Labs from 2/6 significant for WBC 10.7, hemoglobin 12.5, platelets 402, sodium 134, potassium 3.4, alkaline phosphatase 132,  AST 43, and AST 26.  Urinalysis was positive for glucose.  CT angiogram of the head and neck was negative for any large vessel occlusion, but was noted to have mild atheromatous changes about the carotid bifurcation/proximal ICAs without hemodynamically significant greater than 50% stenosis.  UDS negative and alcohol level was undetectable.  Patient was transferred ED to ED for MRI which revealed a 1.5 cm acute ischemic nonhemorrhagic subcortical posterior left frontal infarct with scattered additional punctate nonhemorrhagic infarcts involving the bilateral parieto-occipital regions, and a small remote left basal ganglier lacunar infarct, and you suspected left nasal cavity polyps.  Review of Systems: As mentioned in the history of present illness. All other systems reviewed and are negative. Past Medical History:  Diagnosis Date   Gout    Hypercholesteremia    Hypertension    Past Surgical History:  Procedure Laterality Date   KNEE SURGERY     PROSTATECTOMY     Social History:  reports that he has been smoking e-cigarettes. He has never used smokeless tobacco. He reports current alcohol use of about 4.0 standard drinks of alcohol per week. He reports that he does not use drugs.  Allergies  Allergen Reactions   Allopurinol Itching and Nausea And Vomiting    History reviewed. No pertinent family history.  Prior to Admission medications   Medication Sig Start Date End Date Taking? Authorizing Provider  apixaban (ELIQUIS) 5 MG TABS tablet Take 1 tablet (5 mg total) by mouth 2 (two) times daily. 09/15/22  Yes Dwyane Dee, MD  aspirin EC 81 MG tablet Take 1 tablet (81 mg total) by mouth  daily. Swallow whole. 09/15/22  Yes Dwyane Dee, MD  atorvastatin (LIPITOR) 40 MG tablet Take 40 mg by mouth daily.   Yes [provider]  empagliflozin (JARDIANCE) 10 MG TABS tablet Take 1 tablet (10 mg total) by mouth daily. 09/15/22  Yes Dwyane Dee, MD  febuxostat (ULORIC) 40 MG tablet Take 40  mg by mouth daily.   Yes [provider]  MAGNESIUM PO Take 1 tablet by mouth daily.   Yes [provider]  metoprolol succinate (TOPROL-XL) 25 MG 24 hr tablet Take 1 tablet (25 mg total) by mouth daily. 09/15/22  Yes Dwyane Dee, MD  potassium chloride (K-DUR,KLOR-CON) 10 MEQ tablet Take 20 mEq by mouth daily.   Yes [provider]  sacubitril-valsartan (ENTRESTO) 24-26 MG Take 1 tablet by mouth 2 (two) times daily. 09/15/22  Yes Dwyane Dee, MD  spironolactone (ALDACTONE) 25 MG tablet Take 0.5 tablets (12.5 mg total) by mouth daily. 09/15/22  Yes Dwyane Dee, MD  VITAMIN D PO Take 1 tablet by mouth daily.   Yes [provider]    Physical Exam: Vitals:   09/17/22 0020 09/17/22 0030 09/17/22 0342 09/17/22 0742  BP:  126/81 112/76 128/83  Pulse:  64 84 64  Resp:  (!) 21 16 17   Temp: 97.9 F (36.6 C)  98.4 F (36.9 C) (!) 97.4 F (36.3 C)  TempSrc: Oral   Oral  SpO2:  100% 100% 99%  Weight:      Height:        Constitutional: Adult male currently in no acute distress Eyes: PERRL, lids and conjunctivae normal ENMT: Mucous membranes are moist.  No visible polyp on direct physical exam.  Dentures in place Neck: normal, supple  Respiratory: clear to auscultation bilaterally, no wheezing, no crackles. Normal respiratory effort.   Cardiovascular: Regular rate and rhythm, no murmurs / rubs / gallops. No extremity edema. 2+ pedal pulses. No carotid bruits.  Abdomen: no tenderness, no masses palpated. No hepatosplenomegaly. Bowel sounds positive.  Musculoskeletal: no clubbing / cyanosis. No joint deformity upper and lower extremities. Good ROM, no contractures. Normal muscle tone.  Skin: no rashes, lesions, ulcers. No induration Neurologic: CN 2-12 grossly intact. Sensation intact, DTR normal. Strength 5/5 in all 4.  Psychiatric: Normal judgment and insight. Alert and oriented x 3. Normal mood.   Data Reviewed:  EKG reveals sinus rhythm at 69 bpm  with QTc 536.  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  Patient presented with right arm numbness and weakness.  CT of the head did not note any acute abnormalities and subsequent CTA of the head and neck did not note any large vessel occlusion.  Patient was not a candidate for thrombolytics due to rapidly improving symptoms.  MRI revealed a 1.5 cm acute ischemic nonhemorrhagic subcortical posterior left frontal infarct with scattered additional punctate nonhemorrhagic infarcts involving the bilateral parieto-occipital regions.  Neurology evaluated and recommended oncology consultation to determine whether patient on appropriate agent for anticoagulation and/or cardiology consult for TEE.  Symptoms have resolved at this time. -Admit to medical telemetry bed -Neurochecks -Follow-up lipid panel -Continue aspirin and statin -Cardiology consulted for negative TEE in the setting of suspected to be an embolic stroke with recent admission with splenic infarct  Leukocytosis Acute.  WBC elevated at 10.7.  Possibly reactive to above. -Recheck BC tomorrow morning  Hypokalemia Acute on chronic.  Potassium 3.4 on admission.  Home medication  regimen includes potassium chloride 20 mEq  p.o. daily -Give potassium chloride 40 meq p.o. 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  He reported that he had been taking Eliquis as prescribed. -Oncology consulted in regards to patient being on correct anticoagulation and possible need of further workup as to the cause of patient's  HFrEF Chronic.  Patient currently appears to be euvolemic.  He had been just recently found to have EF 35 -40%, global hypokinesis, grade 1 DD during most recent hospitalization.  Evaluated by cardiology and thought possibly alcohol induced.   He had  been started on Toprol, spironolactone, Jardiance, and Entresto.  Recommended to have outpatient further workup recommended with either CT coronary angio versus myocardial perfusion study with exercise.  -Continue metoprolol, Entresto, spironolactone, and Jardiance -Previously recommended further outpatient workup with coronary CT  Elevated liver enzymes Acute on chronic.  Alkaline phosphatase 132 and AST 43. -Continue to monitor  Thoracic aortic mural thrombus Patient noted to have calcified mural thrombus along the medial wall of the lower thoracic aorta consistent with what was thought to be a chronic process.  Prolonged QT interval QTc 536 -Correct electrolyte abnormalities -Avoid QT prolonging medications  Abnormality of iliac bone Patient noted during the prior hospitalization trabecular thickening and sclerosis involving the iliac bone on the left concerning for possibility of Paget's disease versus osteoblastic metastasis.  -Oncology consulted, follow-up for any further recommendations  Hyperlipidemia Patient has been started on atorvastatin 40 mg during prior hospitalization in regards to the thoracic aortic mural thrombus. -Follow-up lipid panel -Continue aspirin and statin  History of prostate cancer Patient status post TURP.  Follows with the [facility] with last available PSA <0.01 from 12/28/2020 on care everywhere.  Patient reported last PSA check was 0 a couple months ago.  -Recommend outpatient follow-up with the [facility]  Alcohol abuse Patient reported drinking half a gallon of rum and coke every 3 days, but did not have any signs of alcohol withdrawals while in the hospital.  He states that he quit drinking alcohol since leaving the hospital. -Continue to encourage cessation of alcohol use  Gout, without acute flare -Continue febuxostat   Vape user -Continue to encourage cessation of vape use  DVT prophylaxis: Continue Eliquis, unless recommended to change  by oncology Advance Care Planning:   Code Status: Full Code   Consults: Oncology  Family Communication: Wife to be updated  Severity of Illness: The appropriate patient status for this patient is INPATIENT. Inpatient status is judged to be reasonable and necessary in order to provide the required intensity of service to ensure the patient's safety. The patient's presenting symptoms, physical exam findings, and initial radiographic and laboratory data in the context of their chronic comorbidities is felt to place them at high risk for further clinical deterioration. Furthermore, it is not anticipated that the patient will be medically stable for discharge from the hospital within 2 midnights of admission.   * I certify that at the point of admission it is my clinical judgment that the patient will require inpatient hospital care spanning beyond 2 midnights from the point of admission due to high intensity of service, high risk for further deterioration and high frequency of surveillance required.*  Author: Norval Morton, MD 09/17/2022 7:49 AM  For on call review [email or web address].

## 2022-09-17 NOTE — ED Provider Notes (Addendum)
Patient presents from [place] with concerns for possible stroke.  Had a teleneurology evaluation while at [place] who recommended admission and MRI with stroke workup.  Hospitalist declined admission from [place] requesting ED to ED transfer for MRI.  On my evaluation, patient reports some persistent numbness of the right hand.  States that he feels like it is somewhat improved.  He is otherwise stable.  MRI ordered.  MRI with findings suggestive of an acute subcortical stroke in the left frontal region.  Suspicion for embolic event.  Patient updated.  Will plan for admission.  5:48 AM Patient evaluated by neurology.  See her full note for details.  Concern of whether or not patient is having ongoing embolic event despite being on Eliquis for the last several days.  From stroke perspective has had stroke workup and is on appropriate prophylaxis; however, patient has a mass in his iliac which has not been biopsied.  Could cause a hypercoagulable state and may warrant a different approach to anticoagulation.  For this reason will admit for medicine evaluation and hematology/oncology consultation.  If patient were to need to biopsy, patient will need to be on heparin drip given ongoing embolic phenomenon.  Physical Exam  BP 112/76   Pulse 84   Temp 98.4 F (36.9 C)   Resp 16   Ht 1.803 m (5\' 11" )   Wt 86 kg   SpO2 100%   BMI 26.44 kg/m   Physical Exam Awake, alert, no acute distress Procedures  Procedures  ED Course / MDM    Medical Decision Making Amount and/or Complexity of Data Reviewed Labs: ordered. Radiology: ordered.  Risk OTC drugs. Prescription drug management. Decision regarding hospitalization.   Problem List Items Addressed This Visit   None Visit Diagnoses     Weakness    -  Primary   Numbness                 Merrick Feutz, Barbette Hair, MD 09/17/22 7782    Merryl Hacker, MD 09/17/22 [phone]

## 2022-09-17 NOTE — ED Notes (Signed)
Patient rounds complete, patient out of unit for testing

## 2022-09-17 NOTE — Plan of Care (Signed)
  Problem: Education: Goal: Knowledge of General Education information will improve Description: Including pain rating scale, medication(s)/side effects and non-pharmacologic comfort measures Outcome: Progressing   Problem: Health Behavior/Discharge Planning: Goal: Ability to manage health-related needs will improve Outcome: Progressing   Problem: Clinical Measurements: Goal: Ability to maintain clinical measurements within normal limits will improve Outcome: Progressing Goal: Will remain free from infection Outcome: Progressing Goal: Diagnostic test results will improve Outcome: Progressing Goal: Respiratory complications will improve Outcome: Progressing Goal: Cardiovascular complication will be avoided Outcome: Progressing   Problem: Activity: Goal: Risk for activity intolerance will decrease Outcome: Progressing   Problem: Nutrition: Goal: Adequate nutrition will be maintained Outcome: Progressing   Problem: Coping: Goal: Level of anxiety will decrease Outcome: Progressing   Problem: Elimination: Goal: Will not experience complications related to bowel motility Outcome: Progressing Goal: Will not experience complications related to urinary retention Outcome: Progressing   Problem: Pain Managment: Goal: General experience of comfort will improve Outcome: Progressing   Problem: Safety: Goal: Ability to remain free from injury will improve Outcome: Progressing   Problem: Skin Integrity: Goal: Risk for impaired skin integrity will decrease Outcome: Progressing   Problem: Education: Goal: Knowledge of secondary prevention will improve (MUST DOCUMENT ALL) Outcome: Progressing Goal: Knowledge of patient specific risk factors will improve Elta Guadel[place] N/A or DELETE if not current risk factor) Outcome: Progressing   Problem: Ischemic Stroke/TIA Tissue Perfusion: Goal: Complications of ischemic stroke/TIA will be minimized Outcome: Progressing

## 2022-09-18 ENCOUNTER — Encounter (HOSPITAL_COMMUNITY): Payer: Self-pay | Admitting: Family Medicine

## 2022-09-18 ENCOUNTER — Encounter (HOSPITAL_COMMUNITY)
Admission: EM | Disposition: A | Discharge status: Home / Self Care | Payer: Self-pay | Source: Ambulatory Visit | Attending: Family Medicine

## 2022-09-18 ENCOUNTER — Inpatient Hospital Stay (HOSPITAL_COMMUNITY): Payer: No Typology Code available for payment source | Admitting: Certified Registered Nurse Anesthetist

## 2022-09-18 ENCOUNTER — Inpatient Hospital Stay (HOSPITAL_COMMUNITY): Payer: No Typology Code available for payment source

## 2022-09-18 DIAGNOSIS — D735 Infarction of spleen: Secondary | ICD-10-CM | POA: Diagnosis not present

## 2022-09-18 DIAGNOSIS — I639 Cerebral infarction, unspecified: Secondary | ICD-10-CM | POA: Diagnosis not present

## 2022-09-18 DIAGNOSIS — F17210 Nicotine dependence, cigarettes, uncomplicated: Secondary | ICD-10-CM | POA: Diagnosis not present

## 2022-09-18 DIAGNOSIS — D649 Anemia, unspecified: Secondary | ICD-10-CM

## 2022-09-18 DIAGNOSIS — I10 Essential (primary) hypertension: Secondary | ICD-10-CM

## 2022-09-18 DIAGNOSIS — I081 Rheumatic disorders of both mitral and tricuspid valves: Secondary | ICD-10-CM | POA: Diagnosis not present

## 2022-09-18 HISTORY — PX: TEE WITHOUT CARDIOVERSION: SHX5443

## 2022-09-18 HISTORY — PX: BUBBLE STUDY: SHX6837

## 2022-09-18 LAB — ECHOCARDIOGRAM COMPLETE
AR max vel: 3.24 cm2
AV Area VTI: 2.96 cm2
AV Area mean vel: 3.04 cm2
AV Mean grad: 3 mmHg
AV Peak grad: 5.6 mmHg
Ao pk vel: 1.18 m/s
Area-P 1/2: 2.86 cm2
Calc EF: 35.9 %
Height: 71 in
S' Lateral: 3.3 cm
Single Plane A2C EF: 40.6 %
Single Plane A4C EF: 30.8 %
Weight: 3035.2 oz

## 2022-09-18 LAB — ECHO TEE
Calc EF: 61.8 %
Single Plane A2C EF: 68 %
Single Plane A4C EF: 57.4 %

## 2022-09-18 LAB — CBC
HCT: 35.8 % — ABNORMAL LOW (ref 39.0–52.0)
Hemoglobin: 11.7 g/dL — ABNORMAL LOW (ref 13.0–17.0)
MCH: 29.9 pg (ref 26.0–34.0)
MCHC: 32.7 g/dL (ref 30.0–36.0)
MCV: 91.6 fL (ref 80.0–100.0)
Platelets: 355 10*3/uL (ref 150–400)
RBC: 3.91 MIL/uL — ABNORMAL LOW (ref 4.22–5.81)
RDW: 16.7 % — ABNORMAL HIGH (ref 11.5–15.5)
WBC: 10.2 10*3/uL (ref 4.0–10.5)
nRBC: 0 % (ref 0.0–0.2)

## 2022-09-18 LAB — MAGNESIUM: Magnesium: 2.3 mg/dL (ref 1.7–2.4)

## 2022-09-18 LAB — BASIC METABOLIC PANEL
Anion gap: 7 (ref 5–15)
BUN: 8 mg/dL (ref 6–20)
CO2: 22 mmol/L (ref 22–32)
Calcium: 8.5 mg/dL — ABNORMAL LOW (ref 8.9–10.3)
Chloride: 104 mmol/L (ref 98–111)
Creatinine, Ser: 1.15 mg/dL (ref 0.61–1.24)
GFR, Estimated: >60 mL/min (ref >60)
Glucose, Bld: 105 mg/dL — ABNORMAL HIGH (ref 70–99)
Potassium: 3.6 mmol/L (ref 3.5–5.1)
Sodium: 133 mmol/L — ABNORMAL LOW (ref 135–145)

## 2022-09-18 LAB — PSA: Prostatic Specific Antigen: <0.01 ng/mL (ref 0.00–4.00)

## 2022-09-18 SURGERY — ECHOCARDIOGRAM, TRANSESOPHAGEAL
Anesthesia: Monitor Anesthesia Care

## 2022-09-18 MED ORDER — BUTAMBEN-TETRACAINE-BENZOCAINE 2-2-14 % EX AERO
INHALATION_SPRAY | CUTANEOUS | Status: DC | PRN
  Administered 2022-09-18: 1 via TOPICAL

## 2022-09-18 MED ORDER — LIDOCAINE 2% (20 MG/ML) 5 ML SYRINGE
INTRAMUSCULAR | Status: DC | PRN
  Administered 2022-09-18: 100 mg via INTRAVENOUS

## 2022-09-18 MED ORDER — PHENYLEPHRINE 80 MCG/ML (10ML) SYRINGE FOR IV PUSH (FOR BLOOD PRESSURE SUPPORT)
PREFILLED_SYRINGE | INTRAVENOUS | Status: DC | PRN
  Administered 2022-09-18: 160 ug via INTRAVENOUS

## 2022-09-18 MED ORDER — PROPOFOL 500 MG/50ML IV EMUL
INTRAVENOUS | Status: DC | PRN
  Administered 2022-09-18: 150 ug/kg/min via INTRAVENOUS

## 2022-09-18 MED ORDER — SODIUM CHLORIDE 0.9 % IV SOLN: INTRAVENOUS | Status: DC | PRN

## 2022-09-18 MED ORDER — PROPOFOL 10 MG/ML IV BOLUS
INTRAVENOUS | Status: DC | PRN
  Administered 2022-09-18: 100 mg via INTRAVENOUS

## 2022-09-18 NOTE — CV Procedure (Signed)
TRANSESOPHAGEAL ECHOCARDIOGRAM (TEE) NOTE  INDICATIONS: Splenic infarct, evaluate for cardiac source  PROCEDURE:   Informed consent was obtained prior to the procedure. The risks, benefits and alternatives for the procedure were discussed and the patient comprehended these risks.  Risks include, but are not limited to, cough, sore throat, vomiting, nausea, somnolence, esophageal and stomach trauma or perforation, bleeding, low blood pressure, aspiration, pneumonia, infection, trauma to the teeth and death.    After a procedural time-out, the patient was given propofol for sedation by anesthesia. See their separate report.  The patient's heart rate, blood pressure, and oxygen saturation are monitored continuously during the procedure.The oropharynx was anesthetized with topical cetacaine.  The transesophageal probe was inserted in the esophagus and stomach without difficulty and multiple views were obtained.  The patient was kept under observation until the patient left the procedure room.  I was present face-to-face 100% of this time. The patient left the procedure room in stable condition.   Agitated microbubble saline contrast was administered.  COMPLICATIONS:    There were no immediate complications.  Findings:  LEFT VENTRICLE: The left ventricular wall thickness is normal.  The left ventricular cavity is normal in size. Wall motion is normal.  LVEF is 60-65%. (Surface images with Definity contrast were performed). No apical thrombus was noted with contrast.  RIGHT VENTRICLE:  The right ventricle is normal in structure and function without any thrombus or masses.    LEFT ATRIUM:  The left atrium is normal in size without any thrombus or masses.  There is not spontaneous echo contrast ("smoke") in the left atrium consistent with a low flow state.  LEFT ATRIAL APPENDAGE:  The left atrial appendage is free of any thrombus or masses. The appendage has single lobes. Pulse doppler  indicates high flow in the appendage.  ATRIAL SEPTUM:  The atrial septum appears intact and is free of thrombus and/or masses.  There is no evidence for interatrial shunting by color doppler and saline microbubble.  RIGHT ATRIUM:  The right atrium is normal in size and function without any thrombus or masses.  MITRAL VALVE:  The mitral valve is normal in structure and function with  trivial  regurgitation. There may be a partially flail cord given a small mobile density on the tip of the anterior leaflet - the tip of the leaflet is noted to bow/prolpase some. There were no vegetations or stenosis.  AORTIC VALVE:  The aortic valve is trileaflet, normal in structure and function with  no  regurgitation.  There were no vegetations or stenosis  TRICUSPID VALVE:  The tricuspid valve is normal in structure and function with  trivial  regurgitation.  There were no vegetations or stenosis   PULMONIC VALVE:  The pulmonic valve is normal in structure and function with  no  regurgitation.  There were no vegetations or stenosis.   AORTIC ARCH, ASCENDING AND DESCENDING AORTA:  There was no Ron Parker et. Al, 1992) atherosclerosis of the ascending aorta, aortic arch, or proximal descending aorta.  12. PULMONARY VEINS: Anomalous pulmonary venous return was not noted.  13. PERICARDIUM: The pericardium appeared normal and non-thickened.  There is no pericardial effusion.  IMPRESSION:   No LAA thrombus No LV apical thrombus Negative for PFO Possible partially flail cord of the AMVL with prolapse/bowing of the leaflet tip, but only trivial mitral regurgitation LVEF 60-65%  RECOMMENDATIONS:    No cardiac source of embolus noted, however, compared to the prior study on 2/3 (which was reviewed  and re-measured), the LVEF has improved substantially and now appears normal.  Time Spent Directly with the Patient:  60 minutes   Pixie Casino, MD, [facility], [place] Director of  the [facility]  [facility] Diplomate of the American Board of Clinical Lipidology Attending Cardiologist  Direct Dial: [phone]  Fax: [phone]  Website:  [email or web address] 09/18/2022, 12:22 PM

## 2022-09-18 NOTE — Anesthesia Preprocedure Evaluation (Addendum)
Anesthesia Evaluation  Patient identified by MRN, date of birth, ID band Patient awake    Reviewed: Allergy & Precautions, NPO status , Patient's Chart, lab work & pertinent test results  History of Anesthesia Complications Negative for: history of anesthetic complications  Airway Mallampati: II  TM Distance: >3 FB Neck ROM: Full    Dental  (+) Edentulous Lower, Edentulous Upper   Pulmonary Current Smoker and Patient abstained from smoking.   Pulmonary exam normal        Cardiovascular hypertension, Normal cardiovascular exam   TTE 09/13/22: EF 35-40%, global hypokinesis, grade I DD, valves ok    Neuro/Psych CVA  negative psych ROS   GI/Hepatic negative GI ROS,,,(+)     substance abuse  alcohol use  Endo/Other  negative endocrine ROS    Renal/GU negative Renal ROS  negative genitourinary   Musculoskeletal negative musculoskeletal ROS (+)    Abdominal   Peds  Hematology  (+) Blood dyscrasia, anemia   Anesthesia Other Findings Day of surgery medications reviewed with patient.  Reproductive/Obstetrics negative OB ROS                             Anesthesia Physical Anesthesia Plan  ASA: 3  Anesthesia Plan: MAC   Post-op Pain Management: Minimal or no pain anticipated   Induction:   PONV Risk Score and Plan: 0 and Treatment may vary due to age or medical condition and Propofol infusion  Airway Management Planned: Natural Airway and Nasal Cannula  Additional Equipment: None  Intra-op Plan:   Post-operative Plan:   Informed Consent: I have reviewed the patients History and Physical, chart, labs and discussed the procedure including the risks, benefits and alternatives for the proposed anesthesia with the patient or authorized representative who has indicated his/her understanding and acceptance.       Plan Discussed with: CRNA  Anesthesia Plan Comments:         Anesthesia Quick Evaluation

## 2022-09-18 NOTE — Anesthesia Procedure Notes (Signed)
Procedure Name: MAC Date/Time: 09/18/2022 11:51 AM  Performed by: Lowella Dell, CRNAPre-anesthesia Checklist: Patient identified, Emergency Drugs available, Suction available, Patient being monitored and Timeout performed Patient Re-evaluated:Patient Re-evaluated prior to induction Oxygen Delivery Method: Nasal cannula Placement Confirmation: positive ETCO2 Dental Injury: Teeth and Oropharynx as per pre-operative assessment

## 2022-09-18 NOTE — Progress Notes (Addendum)
Heart Failure Nurse Navigator Progress Note   Patient interviewed for HF TOC. EF 35-40% G1DD. Was recently discharged from hospital 2/3-2/5, readmitted for CVA. Spoke with patient and his wife at bedside , education on sign and symptoms of heart failure, daily weights, when to call his doctor or go to the ED. Diet/ fluid restrictions, and taking all his medications as prescribed, and attending all medical appointments. Patient and wife verbalized their understanding.   Patient very vocally declined a HF TOC appointment, reported he Only goes to the [facility] in [place] for all his care and that includes his heart. Patient stated he has a appointment on 09/23/2022.   Navigator will sign off at this time.   Earnestine Leys, BSN, Clinical cytogeneticist Only

## 2022-09-18 NOTE — Anesthesia Postprocedure Evaluation (Signed)
Anesthesia Post Note  Patient: Albert King  Procedure(s) Performed: TRANSESOPHAGEAL ECHOCARDIOGRAM (TEE) BUBBLE STUDY     Patient location during evaluation: PACU Anesthesia Type: MAC Level of consciousness: awake and alert Pain management: pain level controlled Vital Signs Assessment: post-procedure vital signs reviewed and stable Respiratory status: spontaneous breathing, nonlabored ventilation and respiratory function stable Cardiovascular status: blood pressure returned to baseline Postop Assessment: no apparent nausea or vomiting Anesthetic complications: no   No notable events documented.  Last Vitals:  Vitals:   09/18/22 1221 09/18/22 1230  BP: 107/70 113/81  Pulse:  77  Resp: (!) 23 14  Temp:    SpO2:  99%    Last Pain:  Vitals:   09/18/22 1230  TempSrc:   PainSc: 0-No pain                 Marthenia Rolling

## 2022-09-18 NOTE — Progress Notes (Signed)
VeRN reviewed AVS with pt. Pt asked questions and VeRN provided answers and supportive education to pt. Pt stated they understood the discharge instructions.

## 2022-09-18 NOTE — Plan of Care (Signed)
  Problem: Education: Goal: Knowledge of General Education information will improve Description: Including pain rating scale, medication(s)/side effects and non-pharmacologic comfort measures Outcome: Adequate for Discharge   Problem: Health Behavior/Discharge Planning: Goal: Ability to manage health-related needs will improve Outcome: Adequate for Discharge   Problem: Clinical Measurements: Goal: Ability to maintain clinical measurements within normal limits will improve Outcome: Adequate for Discharge Goal: Will remain free from infection Outcome: Adequate for Discharge Goal: Diagnostic test results will improve Outcome: Adequate for Discharge Goal: Respiratory complications will improve Outcome: Adequate for Discharge Goal: Cardiovascular complication will be avoided Outcome: Adequate for Discharge   Problem: Activity: Goal: Risk for activity intolerance will decrease Outcome: Adequate for Discharge   Problem: Nutrition: Goal: Adequate nutrition will be maintained Outcome: Adequate for Discharge   Problem: Coping: Goal: Level of anxiety will decrease Outcome: Adequate for Discharge   Problem: Elimination: Goal: Will not experience complications related to bowel motility Outcome: Adequate for Discharge Goal: Will not experience complications related to urinary retention Outcome: Adequate for Discharge   Problem: Pain Managment: Goal: General experience of comfort will improve Outcome: Adequate for Discharge   Problem: Safety: Goal: Ability to remain free from injury will improve Outcome: Adequate for Discharge   Problem: Skin Integrity: Goal: Risk for impaired skin integrity will decrease Outcome: Adequate for Discharge   Problem: Education: Goal: Knowledge of secondary prevention will improve (MUST DOCUMENT ALL) Outcome: Adequate for Discharge Goal: Knowledge of patient specific risk factors will improve [place] N/A or DELETE if not current risk  factor) Outcome: Adequate for Discharge   Problem: Ischemic Stroke/TIA Tissue Perfusion: Goal: Complications of ischemic stroke/TIA will be minimized Outcome: Adequate for Discharge

## 2022-09-18 NOTE — Discharge Summary (Signed)
Physician Discharge Summary  Albert King JSH:[id number] DOB: 19-Sep-1960 DOA: 09/16/2022  PCP: Pcp, No  Admit date: 09/16/2022 Discharge date: 09/18/2022  Admitted From: Home Disposition: Home  Recommendations for Outpatient Follow-up:  Follow up with PCP in 1 week with repeat CBC/BMP Outpatient follow-up with neurology and hematology/oncology Outpatient follow-up with cardiology Follow up in ED if symptoms worsen or new appear   Home Health: No Equipment/Devices: None  Discharge Condition: Stable CODE STATUS: Full Diet recommendation: Heart healthy/fluid restriction of up to 1200 cc a day  Brief/Interim Summary: 62 y.o. right-handed male with medical history significant of hypertension, hyperlipidemia, gout, recent hospitalization from 09/13/2022-09/15/2022 with diagnosis of splenic infarcts/thoracic aortic mural thrombus along with new diagnosis of acute systolic heart failure for which patient was started on Eliquis, spironolactone, Jardiance and Entresto and cardiology recommended outpatient cardiology workup including stress testing/coronary CT.  She presented on 09/17/2022 with numbness and weakness of right hand.  Subsequently, his symptoms quickly improved.  CT of the head without contrast was negative for acute intracranial abnormity.  CTA head and neck was negative for any large vessel occlusion but was noted to have mild atheromatous changes about the carotid bifurcation/proximal ICAs without hemodynamically significant greater than 50% stenosis.  UDS negative and alcohol level was undetectable.  MRI of brain showed 1.5 cm acute ischemic nonhemorrhagic subcortical posterior left frontal infarct with scattered additional punctate nonhemorrhagic infarcts involving the bilateral parieto-occipital regions, and a small remote left basal ganglier lacunar infarct.  Aspirin and Eliquis were continued.  Neurology recommended TEE to complete stroke workup as patient had a recent 2D echo.   Hematology/oncology was consulted who recommended to continue current anticoagulation and outpatient follow-up with hematology/oncology.  Since his symptoms resolved, PT/OT/SLP were not consulted.  Patient had a TEE today.  He will be discharged home today with outpatient follow-up with PCP/neurology/hematology/oncology  Discharge Diagnoses:   Multifocal embolic appearing strokes presenting with right hand numbness and weakness Hyperlipidemia -symptoms have resolved.  Imaging as above. -Aspirin and Eliquis were continued.  Neurology recommended TEE to complete stroke workup as patient had a recent 2D echo.  Hematology/oncology was consulted who recommended to continue current anticoagulation and outpatient follow-up with hematology/oncology.  Since his symptoms resolved, PT/OT/SLP were not consulted.  Patient had a TEE today which was negative for vegetations. EP was consulted for possible Loop recorder placement: patient decided to follow up with Cardiology at [facility] for the same. He will be discharged home today with outpatient follow-up with PCP/neurology/hematology/oncology/cardiology -Continue aspirin, Eliquis and statin on discharge. -LDL 115.  Goal of less than 70.  Recent A1c of 4.9 -Patient already planned for outpatient event monitor  Hypokalemia -Improved  Recent diagnosis of splenic infarcts/thoracic aortic mural thrombus -Hematology/oncology was consulted who recommended to continue current anticoagulation and outpatient follow-up with hematology/oncology.  Continue Eliquis  Chronic systolic heart failure -Recently diagnosed with a EF of 35 to 40%.  Continue Toprol, spironolactone, Jardiance and Entresto.  Outpatient follow-up with cardiology who had previously recommended outpatient workup including stress testing/coronary CT  Mildly elevated LFTs -No labs today.  Outpatient follow-up  Hyponatremia -Mild.  Outpatient follow-up  Prolonged QT interval -QTc 536 on presentation.   Outpatient follow-up with cardiology  History of prostate cancer -History of TURP.  Outpatient follow-up with urology  Alcohol abuse -No alcohol consumption since patient discharge from the hospital.  Currently showing no signs of withdrawal.  Gout, without acute. -Continue febuxostat  Vape user -Continue to encourage cessation of vape use  Discharge Instructions   Allergies as of 09/18/2022       Reactions   Allopurinol Itching, Nausea And Vomiting        Medication List     TAKE these medications    aspirin EC 81 MG tablet Take 1 tablet (81 mg total) by mouth daily. Swallow whole.   atorvastatin 40 MG tablet Commonly known as: LIPITOR Take 40 mg by mouth daily.   Eliquis 5 MG Tabs tablet Generic drug: apixaban Take 1 tablet (5 mg total) by mouth 2 (two) times daily.   Entresto 24-26 MG Generic drug: sacubitril-valsartan Take 1 tablet by mouth 2 (two) times daily.   febuxostat 40 MG tablet Commonly known as: ULORIC Take 40 mg by mouth daily.   Jardiance 10 MG Tabs tablet Generic drug: empagliflozin Take 1 tablet (10 mg total) by mouth daily.   MAGNESIUM PO Take 1 tablet by mouth daily.   metoprolol succinate 25 MG 24 hr tablet Commonly known as: TOPROL-XL Take 1 tablet (25 mg total) by mouth daily.   potassium chloride 10 MEQ tablet Commonly known as: KLOR-CON M Take 20 mEq by mouth daily.   spironolactone 25 MG tablet Commonly known as: ALDACTONE Take 0.5 tablets (12.5 mg total) by mouth daily.   VITAMIN D PO Take 1 tablet by mouth daily.        Follow-up Information     PCP. Schedule an appointment as soon as possible for a visit in 1 week(s).                 Allergies  Allergen Reactions   Allopurinol Itching and Nausea And Vomiting    Consultations: Neurology/hematology/EP   Procedures/Studies: MR BRAIN WO CONTRAST  Result Date: 09/17/2022 CLINICAL DATA:  Initial evaluation for neuro deficit, stroke suspected.  EXAM: MRI HEAD WITHOUT CONTRAST TECHNIQUE: Multiplanar, multiecho pulse sequences of the brain and surrounding structures were obtained without intravenous contrast. COMPARISON:  Comparison made with prior CTs from 09/16/2022. FINDINGS: Brain: Mild age-related cerebral atrophy. Patchy T2/FLAIR hyperintensity involving the periventricular and deep white matter, consistent with chronic small vessel ischemic disease, mild in nature. Small remote lacunar infarct at the left basal ganglia. Approximate 1.5 cm focus of restricted diffusion involving the subcortical posterior left frontal low, consistent with a small acute ischemic infarct (series 5, image 97). Involvement of the precentral gyrus. Additional punctate subcentimeter cortical infarct noted at the contralateral right parietal lobe (series 5, image 91). Few additional probable punctate ischemic infarcts noted involving the cortical gray matter of the left frontoparietal region (series 5, image 88) as well as the right occipital lobe (series 5, images 84, 81). No associated hemorrhage or mass effect. These are likely embolic in nature. No other evidence for acute or subacute ischemia. Gray-white matter differentiation otherwise maintained. No other areas of chronic cortical infarction. No acute or chronic intracranial blood products. No mass lesion, midline shift or mass effect. No hydrocephalus or extra-axial fluid collection. Pituitary gland and suprasellar region within normal limits. Vascular: Major intracranial vascular flow voids are maintained. Skull and upper cervical spine: Craniocervical junction within normal limits. Bone marrow signal intensity normal. No scalp soft tissue abnormality. Sinuses/Orbits: Prior ocular lens replacement on the left. Few suspected polyps noted within the left nasal cavity, largest of which measures 1.8 cm (series 10, image 6). Mucosal thickening with a few small retention cysts versus polyps noted about the maxillary sinuses  as well. No mastoid effusion. Other: None. IMPRESSION: 1. 1.5 cm acute ischemic nonhemorrhagic subcortical  posterior left frontal infarct, with a few scattered additional punctate nonhemorrhagic infarcts involving the bilateral parieto-occipital regions as above. These are likely embolic in nature. 2. Underlying mild age-related cerebral atrophy with chronic small vessel ischemic disease, with a small remote left basal ganglia lacunar infarct. 3. 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 Correlation with physical exam suggested. Electronically Signed   By: Jeannine Boga M.D.   On: 09/17/2022 03:30   CT ANGIO HEAD NECK W WO CM  Result Date: 09/17/2022 CLINICAL DATA:  Initial evaluation for stroke. EXAM: CT ANGIOGRAPHY HEAD AND NECK TECHNIQUE: Multidetector CT imaging of the head and neck was performed using the standard protocol during bolus administration of intravenous contrast. Multiplanar CT image reconstructions and MIPs were obtained to evaluate the vascular anatomy. Carotid stenosis measurements (when applicable) are obtained utilizing NASCET criteria, using the distal internal carotid diameter as the denominator. RADIATION DOSE REDUCTION: This exam was performed according to the departmental dose-optimization program which includes automated exposure control, adjustment of the mA and/or kV according to patient size and/or use of iterative reconstruction technique. CONTRAST:  77mL OMNIPAQUE IOHEXOL 350 MG/ML SOLN COMPARISON:  Prior CT from earlier the same day. FINDINGS: CTA NECK FINDINGS Aortic arch: Visualized aortic arch normal caliber standard branch pattern. Mild aortic atherosclerosis. No stenosis about the origin the great vessels. Right carotid system: Right common and internal carotid arteries are patent without dissection. Mild eccentric soft plaque within the mid right CCA without significant stenosis. Mild calcified plaque about the right carotid  bulb/proximal right ICA without hemodynamically significant greater than 50% stenosis. Left carotid system: Left common and internal carotid arteries are patent without dissection. Mild calcified plaque about the left carotid bulb/proximal left ICA without hemodynamically significant greater than 50% stenosis. Vertebral arteries: Both vertebral arteries arise from the subclavian arteries. No proximal subclavian artery stenosis. Both vertebral arteries widely patent without stenosis, dissection or occlusion. Skeleton: No discrete or worrisome osseous lesions. Patient is edentulous. Other neck: No other acute soft tissue abnormality within the neck. Upper chest: Visualized upper chest demonstrates no acute finding. Review of the MIP images confirms the above findings CTA HEAD FINDINGS Anterior circulation: Atheromatous change within the carotid siphons with associated moderate multifocal narrowing. A1 segments, anterior communicating complex common anterior cerebral arteries widely patent. No M1 stenosis or occlusion. No proximal MCA branch occlusion or high-grade stenosis. Distal MCA branches perfused and symmetric. Posterior circulation: Both V4 segments patent without significant stenosis. Both PICA patent. Basilar patent without stenosis. Superior cerebellar and posterior cerebral arteries patent bilaterally. Venous sinuses: Grossly patent allowing for timing the contrast bolus. Anatomic variants: None significant.  No aneurysm. Review of the MIP images confirms the above findings IMPRESSION: 1. Negative CTA for large vessel occlusion or other emergent finding. 2. Atheromatous change about the carotid siphons with associated moderate multifocal narrowing. 3. Mild atheromatous change about the carotid bifurcations/proximal ICAs without hemodynamically significant greater than 50% stenosis. Aortic Atherosclerosis (ICD10-I70.0). Electronically Signed   By: Jeannine Boga M.D.   On: 09/17/2022 00:15   CT HEAD  CODE STROKE WO CONTRAST  Result Date: 09/16/2022 CLINICAL DATA:  Code stroke. Neuro deficit. Acute. Stroke suspected. Right hand numbness. EXAM: CT HEAD WITHOUT CONTRAST TECHNIQUE: Contiguous axial images were obtained from the base of the skull through the vertex without intravenous contrast. RADIATION DOSE REDUCTION: This exam was performed according to the departmental dose-optimization program which includes automated exposure control, adjustment of the mA and/or kV according to patient size  and/or use of iterative reconstruction technique. COMPARISON:  None Available. FINDINGS: Brain: Normal appearance without evidence of old or acute infarction, mass lesion, hemorrhage, hydrocephalus or extra-axial collection. Vascular: There is atherosclerotic calcification of the major vessels at the base of the brain. Skull: Normal Sinuses/Orbits: Mild seasonal mucosal thickening.  Orbits negative. Other: None ASPECTS ([place] Stroke Program Early CT Score) - Ganglionic level infarction (caudate, lentiform nuclei, internal capsule, insula, M1-M3 cortex): 7 - Supraganglionic infarction (M4-M6 cortex): 3 Total score (0-10 with 10 being normal): 10 IMPRESSION: 1. Normal head CT. 2. Aspects is 10. These results were called by telephone at the time of interpretation on 09/16/2022 at 9:52 pm to provider Regan Lemming , who verbally acknowledged these results. Electronically Signed   By: Nelson Chimes M.D.   On: 09/16/2022 21:54   ECHOCARDIOGRAM COMPLETE  Result Date: 09/13/2022    ECHOCARDIOGRAM REPORT   Patient Name:   Jennings Corado Date of Exam: 09/13/2022 Medical Rec #:  [id number]      Height:       71.0 in Accession #:    [id number]     Weight:       189.7 lb Date of Birth:  1960-08-20       BSA:          2.062 m Patient Age:    62 years       BP:           133/76 mmHg Patient Gender: M              HR:           55 bpm. Exam Location:  Inpatient Procedure: 2D Echo, Cardiac Doppler and Color Doppler Indications:    Abnormal  ECG                 Splenic infarct  History:        Patient has no prior history of Echocardiogram examinations.                 Risk Factors:Hypertension and Dyslipidemia.  Sonographer:    Clayton Lefort RDCS (AE) Referring Phys: Eustaquio Boyden A SMITH IMPRESSIONS  1. Left ventricular ejection fraction, by estimation, is 35 to 40%. The left ventricle has moderately decreased function. The left ventricle demonstrates global hypokinesis. Left ventricular diastolic parameters are consistent with Grade I diastolic dysfunction (impaired relaxation). There is incoordinate septal motion.  2. Right ventricular systolic function is normal. The right ventricular size is normal. Tricuspid regurgitation signal is inadequate for assessing PA pressure.  3. The mitral valve is abnormal. Trivial mitral valve regurgitation.  4. The aortic valve is tricuspid. Aortic valve regurgitation is not visualized.  5. The inferior vena cava is dilated in size with >50% respiratory variability, suggesting right atrial pressure of 8 mmHg. Comparison(s): No prior Echocardiogram. FINDINGS  Left Ventricle: Left ventricular ejection fraction, by estimation, is 35 to 40%. The left ventricle has moderately decreased function. The left ventricle demonstrates global hypokinesis. The left ventricular internal cavity size was normal in size. There is no left ventricular hypertrophy. Incoordinate septal motion. Left ventricular diastolic parameters are consistent with Grade I diastolic dysfunction (impaired relaxation). Indeterminate filling pressures. Right Ventricle: The right ventricular size is normal. No increase in right ventricular wall thickness. Right ventricular systolic function is normal. Tricuspid regurgitation signal is inadequate for assessing PA pressure. Left Atrium: Left atrial size was normal in size. Right Atrium: Right atrial size was normal in size. Pericardium: There is no evidence of pericardial  effusion. Mitral Valve: The mitral valve is  abnormal. There is mild thickening of the anterior and posterior mitral valve leaflet(s). Trivial mitral valve regurgitation. Tricuspid Valve: The tricuspid valve is grossly normal. Tricuspid valve regurgitation is not demonstrated. Aortic Valve: The aortic valve is tricuspid. Aortic valve regurgitation is not visualized. Aortic valve mean gradient measures 3.0 mmHg. Aortic valve peak gradient measures 5.6 mmHg. Aortic valve area, by VTI measures 2.96 cm. Pulmonic Valve: The pulmonic valve was normal in structure. Pulmonic valve regurgitation is not visualized. Aorta: The aortic root and ascending aorta are structurally normal, with no evidence of dilitation. Venous: The inferior vena cava is dilated in size with greater than 50% respiratory variability, suggesting right atrial pressure of 8 mmHg. IAS/Shunts: No atrial level shunt detected by color flow Doppler.  LEFT VENTRICLE PLAX 2D LVIDd:         5.20 cm     Diastology LVIDs:         3.30 cm     LV e' medial:    6.85 cm/s LV PW:         1.00 cm     LV E/e' medial:  8.7 LV IVS:        1.00 cm     LV e' lateral:   5.00 cm/s LVOT diam:     2.40 cm     LV E/e' lateral: 11.9 LV SV:         74 LV SV Index:   36 LVOT Area:     4.52 cm  LV Volumes (MOD) LV vol d, MOD A2C: 88.2 ml LV vol d, MOD A4C: 90.3 ml LV vol s, MOD A2C: 53.6 ml LV vol s, MOD A4C: 64.0 ml LV SV MOD A2C:     34.6 ml LV SV MOD A4C:     90.3 ml LV SV MOD BP:      30.1 ml RIGHT VENTRICLE          IVC RV Basal diam:  2.60 cm  IVC diam: 2.20 cm TAPSE (M-mode): 2.2 cm LEFT ATRIUM           Index        RIGHT ATRIUM           Index LA diam:      3.60 cm 1.75 cm/m   RA Area:     15.20 cm LA Vol (A2C): 36.8 ml 17.85 ml/m  RA Volume:   39.10 ml  18.96 ml/m LA Vol (A4C): 58.9 ml 28.57 ml/m  AORTIC VALVE AV Area (Vmax):    3.24 cm AV Area (Vmean):   3.04 cm AV Area (VTI):     2.96 cm AV Vmax:           118.00 cm/s AV Vmean:          75.500 cm/s AV VTI:            0.251 m AV Peak Grad:      5.6 mmHg AV  Mean Grad:      3.0 mmHg LVOT Vmax:         84.40 cm/s LVOT Vmean:        50.800 cm/s LVOT VTI:          0.164 m LVOT/AV VTI ratio: 0.65  AORTA Ao Root diam: 3.30 cm Ao Asc diam:  3.30 cm MITRAL VALVE MV Area (PHT): 2.86 cm    SHUNTS MV Decel Time: 265 msec    Systemic VTI:  0.16 m MV  E velocity: 59.70 cm/s  Systemic Diam: 2.40 cm MV A velocity: 61.80 cm/s MV E/A ratio:  0.97 Zoila Shutter MD Electronically signed by Zoila Shutter MD Signature Date/Time: 09/13/2022/3:27:57 PM    Final    CT Angio Chest Aorta W and/or Wo Contrast  Result Date: 09/13/2022 CLINICAL DATA:  Acute aortic syndrome suspected. Splenic infarct on abdominal CT earlier same day EXAM: CT ANGIOGRAPHY CHEST WITH CONTRAST TECHNIQUE: Multidetector CT imaging of the chest was performed using the standard protocol during bolus administration of intravenous contrast. Multiplanar CT image reconstructions and MIPs were obtained to evaluate the vascular anatomy. RADIATION DOSE REDUCTION: This exam was performed according to the departmental dose-optimization program which includes automated exposure control, adjustment of the mA and/or kV according to patient size and/or use of iterative reconstruction technique. CONTRAST:  OMNIPAQUE IOHEXOL 350 MG/ML SOLN COMPARISON:  CT abdomen/pelvis from earlier same day FINDINGS: Cardiovascular: Ascending thoracic aorta measures 3.3 cm diameter. Descending thoracic aorta is 2.6 cm diameter. Mild atherosclerotic calcification is noted in the wall of the thoracic aorta. No evidence for dissection flap within the thoracic aorta. Aortic arch vessel branch anatomy is widely patent. There is some minimal calcified mural thrombus along the medial wall of the lower thoracic aorta consistent with chronic process (axial 108/4). Enlargement of the pulmonary outflow tract/main pulmonary arteries suggests pulmonary arterial hypertension. No evidence for central pulmonary embolus in the main pulmonary arteries, lobar  pulmonary arteries, or segmental pulmonary arteries. Imaging on the study was carried down into the mid abdomen and the celiac axis and SMA are widely patent. Splenic artery appears widely patent without appreciable atherosclerotic disease, filling defect or aneurysm. Mediastinum/Nodes: No mediastinal lymphadenopathy. There is no hilar lymphadenopathy. The esophagus has normal imaging features. There is no axillary lymphadenopathy. Lungs/Pleura: No focal airspace consolidation. No suspicious pulmonary nodule or mass. No pulmonary edema or pleural effusion. Upper Abdomen: Deferred to dedicated abdomen and pelvis CT exam performed immediately prior to this study. Musculoskeletal: No worrisome lytic or sclerotic osseous abnormality. Review of the MIP images confirms the above findings. IMPRESSION: 1. No evidence for thoracic aortic aneurysm or dissection. There is some minimal calcified mural thrombus along the medial wall of the lower thoracic aorta consistent with chronic process. 2. Enlargement of the pulmonary outflow tract/main pulmonary arteries suggests pulmonary arterial hypertension. 3. No evidence for central pulmonary embolus. Electronically Signed   By: Kennith Center M.D.   On: 09/13/2022 05:18   CT Abdomen Pelvis W Contrast  Result Date: 09/13/2022 CLINICAL DATA:  Left lower quadrant abdominal pain. EXAM: CT ABDOMEN AND PELVIS WITH CONTRAST TECHNIQUE: Multidetector CT imaging of the abdomen and pelvis was performed using the standard protocol following bolus administration of intravenous contrast. RADIATION DOSE REDUCTION: This exam was performed according to the departmental dose-optimization program which includes automated exposure control, adjustment of the mA and/or kV according to patient size and/or use of iterative reconstruction technique. CONTRAST:  OMNIPAQUE IOHEXOL 300 MG/ML  SOLN COMPARISON:  06/05/2009. FINDINGS: Lower chest: No acute abnormality. Hepatobiliary: No focal liver  abnormality is seen. Fatty infiltration of the liver is noted. No gallstones, gallbladder wall thickening, or biliary dilatation. Pancreas: Unremarkable. No pancreatic ductal dilatation or surrounding inflammatory changes. Spleen: Multifocal hypodense regions are present in the spleen, suspicious for infarcts. No subcapsular hematoma is seen. Adrenals/Urinary Tract: The adrenal glands are within normal limits. Kidneys enhance symmetrically. No renal calculus or hydronephrosis. Bladder is unremarkable. Stomach/Bowel: Stomach is within normal limits. Appendix appears normal. No evidence of  bowel wall thickening, distention, or inflammatory changes. No free air or pneumatosis. Vascular/Lymphatic: Aortic atherosclerosis. No enlarged abdominal or pelvic lymph nodes. Reproductive: The prostate gland is not seen. Other: No abdominopelvic ascites. Musculoskeletal: Trabecular thickening and sclerosis are noted in the iliac bone on the left, suggesting Paget's disease. Mild degenerative changes in the thoracolumbar spine. No acute osseous abnormality IMPRESSION: 1. Regional hypodensities in the spleen, suggesting infarcts and indeterminate in age. 2. Hepatic steatosis. 3. Trabecular thickening and sclerosis involving the iliac bone on the left, possible Paget's disease versus osteoblastic metastasis. Electronically Signed   By: Brett Fairy M.D.   On: 09/13/2022 02:30    TEE: didn't show any vegetations  Subjective: Patient seen and examined at bedside.  His symptoms have resolved.  Denies any worsening chest pain, fever, vomiting.  Feels okay to go home today.  Discharge Exam: Vitals:   09/18/22 0353 09/18/22 0805  BP: 103/66 102/76  Pulse: 73 75  Resp: 17 18  Temp: 99 F (37.2 C) 98.4 F (36.9 C)  SpO2: 97% 99%    General: Pt is alert, awake, not in acute distress.  On room air. Cardiovascular: rate controlled, S1/S2 + Respiratory: bilateral decreased breath sounds at bases Abdominal: Soft, NT, ND,  bowel sounds + Extremities: Trace lower extremity edema; no cyanosis    The results of significant diagnostics from this hospitalization (including imaging, microbiology, ancillary and laboratory) are listed below for reference.     Microbiology: No results found for this or any previous visit (from the past 240 hour(s)).   Labs: BNP (last 3 results) No results for input(s): "BNP" in the last 8760 hours. Basic Metabolic Panel: Recent Labs  Lab 09/13/22 1229 09/14/22 0254 09/15/22 0211 09/16/22 2131 09/18/22 0253  NA 134* 137 135 134* 133*  K 2.9* 3.7 3.0* 3.4* 3.6  CL 102 106 100 102 104  CO2 21* 23 23 22 22   GLUCOSE 103* 83 97 91 105*  BUN 6 <5* 5* 7 8  CREATININE 0.82 0.95 1.02 1.13 1.15  CALCIUM 8.5* 8.4* 8.2* 9.1 8.5*  MG 1.7  --  1.7  --  2.3   Liver Function Tests: Recent Labs  Lab 09/12/22 2231 09/13/22 1229 09/14/22 0254 09/16/22 2131  AST 28 34 28 43*  ALT 20 20 18 26   ALKPHOS 138* 143* 126 132*  BILITOT 2.4* 2.2* 1.9* 0.7  PROT 6.8 6.2* 5.5* 6.8  ALBUMIN 3.5 3.0* 2.6* 3.5   Recent Labs  Lab 09/12/22 2231  LIPASE 15   No results for input(s): "AMMONIA" in the last 168 hours. CBC: Recent Labs  Lab 09/13/22 1229 09/14/22 0254 09/15/22 0211 09/16/22 2131 09/18/22 0253  WBC 9.5 7.2 7.9 10.7* 10.2  NEUTROABS 6.3  --   --  6.2  --   HGB 13.0 12.1* 11.3* 12.5* 11.7*  HCT 38.0* 37.3* 34.4* 37.2* 35.8*  MCV 88.8 92.1 91.2 89.2 91.6  PLT 468* 365 344 402* 355   Cardiac Enzymes: No results for input(s): "CKTOTAL", "CKMB", "CKMBINDEX", "TROPONINI" in the last 168 hours. BNP: Invalid input(s): "POCBNP" CBG: Recent Labs  Lab 09/16/22 2135  GLUCAP 98   D-Dimer No results for input(s): "DDIMER" in the last 72 hours. Hgb A1c No results for input(s): "HGBA1C" in the last 72 hours. Lipid Profile Recent Labs    09/17/22 0824  CHOL 196  HDL 56  LDLCALC 115*  TRIG 124  CHOLHDL 3.5   Thyroid function studies No results for input(s): "TSH",  "T4TOTAL", "T3FREE", "THYROIDAB" in  the last 72 hours.  Invalid input(s): "FREET3" Anemia work up No results for input(s): "VITAMINB12", "FOLATE", "FERRITIN", "TIBC", "IRON", "RETICCTPCT" in the last 72 hours. Urinalysis    Component Value Date/Time   COLORURINE YELLOW 09/16/2022 2225   APPEARANCEUR CLEAR 09/16/2022 2225   LABSPEC 1.007 09/16/2022 2225   PHURINE 6.0 09/16/2022 2225   GLUCOSEU 500 (A) 09/16/2022 2225   HGBUR NEGATIVE 09/16/2022 2225   BILIRUBINUR NEGATIVE 09/16/2022 2225   KETONESUR NEGATIVE 09/16/2022 2225   PROTEINUR NEGATIVE 09/16/2022 2225   UROBILINOGEN 0.2 06/05/2009 1016   NITRITE NEGATIVE 09/16/2022 2225   LEUKOCYTESUR NEGATIVE 09/16/2022 2225   Sepsis Labs Recent Labs  Lab 09/14/22 0254 09/15/22 0211 09/16/22 2131 09/18/22 0253  WBC 7.2 7.9 10.7* 10.2   Microbiology No results found for this or any previous visit (from the past 240 hour(s)).   Time coordinating discharge: 35 minutes  SIGNED:   Aline August, MD  [facility] 09/18/2022, 10:25 AM

## 2022-09-18 NOTE — Interval H&P Note (Signed)
History and Physical Interval Note:  09/18/2022 11:52 AM  Albert King  has presented today for surgery, with the diagnosis of SPLENIC INFARCT.  The various methods of treatment have been discussed with the patient and family. After consideration of risks, benefits and other options for treatment, the patient has consented to  Procedure(s): TRANSESOPHAGEAL ECHOCARDIOGRAM (TEE) (N/A) as a surgical intervention.  The patient's history has been reviewed, patient examined, no change in status, stable for surgery.  I have reviewed the patient's chart and labs.  Questions were answered to the patient's satisfaction.     Pixie Casino

## 2022-09-18 NOTE — Transfer of Care (Signed)
Immediate Anesthesia Transfer of Care Note  Patient: Albert King  Procedure(s) Performed: TRANSESOPHAGEAL ECHOCARDIOGRAM (TEE) BUBBLE STUDY  Patient Location: PACU and Endoscopy Unit  Anesthesia Type:MAC  Level of Consciousness: awake and patient cooperative  Airway & Oxygen Therapy: Patient Spontanous Breathing  Post-op Assessment: Report given to RN and Post -op Vital signs reviewed and stable  Post vital signs: Reviewed and stable  Last Vitals:  Vitals Value Taken Time  BP 107/70 09/18/22 1221  Temp    Pulse 84 09/18/22 1225  Resp 30 09/18/22 1225  SpO2 93 % 09/18/22 1225  Vitals shown include unvalidated device data.  Last Pain:  Vitals:   09/18/22 1221  TempSrc:   PainSc: 0-No pain         Complications: No notable events documented.

## 2022-09-18 NOTE — Progress Notes (Signed)
PROGRESS NOTE    Albert King  VOH:[id number] DOB: November 03, 1960 DOA: 09/16/2022 PCP: Pcp, No   Brief Narrative:  62 y.o. right-handed male with medical history significant of hypertension, hyperlipidemia, gout, recent hospitalization from 09/13/2022-09/15/2022 with diagnosis of splenic infarcts/thoracic aortic mural thrombus along with new diagnosis of acute systolic heart failure for which patient was started on Eliquis, spironolactone, Jardiance and Entresto and cardiology recommended outpatient cardiology workup including stress testing/coronary CT.  He presented on 09/17/2022 with numbness and weakness of right hand.  Subsequently, his symptoms quickly improved.  CT of the head without contrast was negative for acute intracranial abnormity.  CTA head and neck was negative for any large vessel occlusion but was noted to have mild atheromatous changes about the carotid bifurcation/proximal ICAs without hemodynamically significant greater than 50% stenosis.  UDS negative and alcohol level was undetectable.  MRI of brain showed 1.5 cm acute ischemic nonhemorrhagic subcortical posterior left frontal infarct with scattered additional punctate nonhemorrhagic infarcts involving the bilateral parieto-occipital regions, and a small remote left basal ganglier lacunar infarct. Neurology was consulted: recommended TEE.  Assessment & Plan:   Multifocal embolic appearing strokes presenting with right hand numbness and weakness Hyperlipidemia -symptoms have resolved.  Imaging as above. -Aspirin and Eliquis were continued.  Neurology recommended TEE to complete stroke workup as patient had a recent 2D echo.  Hematology/oncology was consulted who recommended to continue current anticoagulation and outpatient follow-up with hematology/oncology.  Since his symptoms resolved, PT/OT/SLP were not consulted. TEE planned for today along with possible loop recorder placement.   -Continue aspirin, Eliquis and statin -LDL 115.   Goal of less than 70.  Recent A1c of 4.9   Hypokalemia -Improved   Recent diagnosis of splenic infarcts/thoracic aortic mural thrombus -Hematology/oncology was consulted who recommended to continue current anticoagulation and outpatient follow-up with hematology/oncology.  Continue Eliquis   Chronic systolic heart failure -Recently diagnosed with a EF of 35 to 40%.  Continue Toprol, spironolactone, Jardiance and Entresto on discharge.  Outpatient follow-up with cardiology who had previously recommended outpatient workup including stress testing/coronary CT   Mildly elevated LFTs -No labs today.  Outpatient follow-up   Hyponatremia -Mild.  Outpatient follow-up   Prolonged QT interval -QTc 536 on presentation.  EP has been consulted for possible loop recorder placement   History of prostate cancer -History of TURP.  Outpatient follow-up with urology   Alcohol abuse -No alcohol consumption since patient discharge from the hospital.  Currently showing no signs of withdrawal.   Gout, without acute. -Continue febuxostat   Vape user -Continue to encourage cessation of vape use       DVT prophylaxis: Eliquis Code Status:  Full Family Communication: Wife at bedside Disposition Plan: Status is: Inpatient Remains inpatient appropriate because: of severity of illness    Consultants: Neurology/hematology/EP  Procedures: None  Antimicrobials: None   Subjective: Patient seen and examined at bedside. His symptoms have resolved. Denies any worsening chest pain, fever, vomiting   Objective: Vitals:   09/18/22 1110 09/18/22 1221 09/18/22 1230 09/18/22 1504  BP: 119/82 107/70 113/81 102/71  Pulse: 73  77 85  Resp: 17 (!) 23 14 18   Temp: 98 F (36.7 C)   98.5 F (36.9 C)  TempSrc: Temporal   Oral  SpO2: 98%  99% 100%  Weight:      Height:        Intake/Output Summary (Last 24 hours) at 09/18/2022 1548 Last data filed at 09/18/2022 1204 Gross per 24 hour  Intake 738.65  ml  Output --  Net 738.65 ml   Filed Weights   09/16/22 2031  Weight: 86 kg    Examination:  General: Pt is alert, awake, not in acute distress.  On room air. Cardiovascular: rate controlled, S1/S2 + Respiratory: bilateral decreased breath sounds at bases Abdominal: Soft, NT, ND, bowel sounds + Extremities: Trace lower extremity edema; no cyanosis      Data Reviewed: I have personally reviewed following labs and imaging studies  CBC: Recent Labs  Lab 09/13/22 1229 09/14/22 0254 09/15/22 0211 09/16/22 2131 09/18/22 0253  WBC 9.5 7.2 7.9 10.7* 10.2  NEUTROABS 6.3  --   --  6.2  --   HGB 13.0 12.1* 11.3* 12.5* 11.7*  HCT 38.0* 37.3* 34.4* 37.2* 35.8*  MCV 88.8 92.1 91.2 89.2 91.6  PLT 468* 365 344 402* 761   Basic Metabolic Panel: Recent Labs  Lab 09/13/22 1229 09/14/22 0254 09/15/22 0211 09/16/22 2131 09/18/22 0253  NA 134* 137 135 134* 133*  K 2.9* 3.7 3.0* 3.4* 3.6  CL 102 106 100 102 104  CO2 21* 23 23 22 22   GLUCOSE 103* 83 97 91 105*  BUN 6 <5* 5* 7 8  CREATININE 0.82 0.95 1.02 1.13 1.15  CALCIUM 8.5* 8.4* 8.2* 9.1 8.5*  MG 1.7  --  1.7  --  2.3   GFR: Estimated Creatinine Clearance: 72.8 mL/min (by C-G formula based on SCr of 1.15 mg/dL). Liver Function Tests: Recent Labs  Lab 09/12/22 2231 09/13/22 1229 09/14/22 0254 09/16/22 2131  AST 28 34 28 43*  ALT 20 20 18 26   ALKPHOS 138* 143* 126 132*  BILITOT 2.4* 2.2* 1.9* 0.7  PROT 6.8 6.2* 5.5* 6.8  ALBUMIN 3.5 3.0* 2.6* 3.5   Recent Labs  Lab 09/12/22 2231  LIPASE 15   No results for input(s): "AMMONIA" in the last 168 hours. Coagulation Profile: Recent Labs  Lab 09/16/22 2131  INR 1.2   Cardiac Enzymes: No results for input(s): "CKTOTAL", "CKMB", "CKMBINDEX", "TROPONINI" in the last 168 hours. BNP (last 3 results) No results for input(s): "PROBNP" in the last 8760 hours. HbA1C: No results for input(s): "HGBA1C" in the last 72 hours. CBG: Recent Labs  Lab 09/16/22 2135   GLUCAP 98   Lipid Profile: Recent Labs    09/17/22 0824  CHOL 196  HDL 56  LDLCALC 115*  TRIG 124  CHOLHDL 3.5   Thyroid Function Tests: No results for input(s): "TSH", "T4TOTAL", "FREET4", "T3FREE", "THYROIDAB" in the last 72 hours. Anemia Panel: No results for input(s): "VITAMINB12", "FOLATE", "FERRITIN", "TIBC", "IRON", "RETICCTPCT" in the last 72 hours. Sepsis Labs: No results for input(s): "PROCALCITON", "LATICACIDVEN" in the last 168 hours.  No results found for this or any previous visit (from the past 240 hour(s)).    Scheduled Meds:  apixaban  5 mg Oral BID   aspirin EC  81 mg Oral Daily   atorvastatin  40 mg Oral Daily   empagliflozin  10 mg Oral Daily   febuxostat  40 mg Oral Daily   metoprolol succinate  25 mg Oral Daily   potassium chloride  20 mEq Oral Daily   sacubitril-valsartan  1 tablet Oral BID   sodium chloride flush  3 mL Intravenous Q12H   Continuous Infusions:        Aline August, MD [facility] 09/18/2022, 3:48 PM

## 2022-09-18 NOTE — Plan of Care (Signed)
   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   Recommend continue anticoagulation with eliquis and aspirin as discussed in Dr. Lyn Records consultation note.   F/U with stroke clinic oupt.

## 2022-09-18 NOTE — Consult Note (Addendum)
ELECTROPHYSIOLOGY CONSULT NOTE  Patient ID: Albert King MRN: [id number], DOB/AGE: [birth year]   Admit date: 09/16/2022 Date of Consult: 09/18/2022  Primary Physician: Merryl Hacker, No Primary Cardiologist: [facility] Reason for Consultation: Cryptogenic stroke ; recommendations regarding Implantable Loop Recorder, requested by Dr. Curly Shores  History of Present Illness Albert King was admitted on 09/16/2022 with R arm numbness/weakness that resolved spontaneously.    PMHx includes:HTN, HLD, prostate cancer (TURP 2021), smoker/VAPE use/ETOH abuse Reported last admission to 1/2gallon of rum in 3 days  He was discharged 09/15/21 after an admission with abdominal pain, w/u noted splenic infarct of unknown age, CT further revealed minimal calcified mural thrombus along the medial wall of the lower thoracic aorta consistent with a chronic process and reduced LVEF 35-40% He was seen by cardiology who recommended GDMT as able and out pt follow up, [facility]  He was treated with heparin gtt >> discharged on Eliquis  His initial EKG also noted marked QT prolongation Initial K+ 3.7 > 2.9 > 3.7 > 3.0 mag was 1.7  Neurology notes: "Multifocal embolic appearing strokes, suspect the same process that contributed to his splenic infarcts is also leading to these embolic events.  Based on imaging features I believe some of these events are older and newer, but certainly the largest (still very small) infarct correlates with the patient's symptoms of right hand weakness (which has resolved due to very small size of the stroke).  Due to the very small size of the strokes, and ongoing embolic events, risk of hemorrhagic conversion from continuing Eliquis is outweighed by risk of further embolic events from stopping Eliquis.  However, unclear primary indication for Eliquis at this time, and query whether further workup would reveal that a different agent is indicated.  For example if he is having hypercoagulable state  from malignancy with concern for iliac bone findings, would be helpful to consult oncology to determine if a different agent such as Lovenox would be preferable.  I do not think the previously mentioned calcified mural thrombus in the lower thoracic aorta is a significant source of emboli to the brain"  Recommended TEE and consideration for loop  He has been seen by oncology, low suspicion of metastaic disease (favoring Paget's (pelvis lesions)      he has undergone workup for stroke including echocardiogram and carotid dopplers.  The patient has been monitored on telemetry which has demonstrated sinus rhythm with no arrhythmias.  Inpatient stroke work-up is to be completed with a TEE.    TEE/TTE Today  1. Surface images obtained with Definity contrast - no apical thrombus  was noted.. Left ventricular ejection fraction, by estimation, is 60 to  65%. Left ventricular ejection fraction by 2D MOD biplane is 61.8 %. The  left ventricle has normal function.   2. Right ventricular systolic function is normal. The right ventricular  size is normal.   3. No left atrial/left atrial appendage thrombus was detected.   4. Very small mobile material on the tip of the anterior mitral leaflet -  may be loose cord material as the tip of the leaflet is seen to borderline  prolapse. The mitral valve is abnormal. Trivial mitral valve  regurgitation.   5. The aortic valve is tricuspid. Aortic valve regurgitation is not  visualized.   6. Agitated saline contrast bubble study was negative, with no evidence  of any interatrial shunt.  Comparison(s): Compared to the most recent echo on 09/13/2022- the LVEF has  significantly improved to  60-65%.   Conclusion(s)/Recommendation(s): No LA/LAA thrombus identified. Negative  bubble study for interatrial shunt. No intracardiac source of embolism  detected on this on this transesophageal echocardiogram.      Echocardiogram last  admission 09/13/22  1. Left  ventricular ejection fraction, by estimation, is 35 to 40%. The  left ventricle has moderately decreased function. The left ventricle  demonstrates global hypokinesis. Left ventricular diastolic parameters are  consistent with Grade I diastolic  dysfunction (impaired relaxation). There is incoordinate septal motion.   2. Right ventricular systolic function is normal. The right ventricular  size is normal. Tricuspid regurgitation signal is inadequate for assessing  PA pressure.   3. The mitral valve is abnormal. Trivial mitral valve regurgitation.   4. The aortic valve is tricuspid. Aortic valve regurgitation is not  visualized.   5. The inferior vena cava is dilated in size with >50% respiratory  variability, suggesting right atrial pressure of 8 mmHg.   Comparison(s): No prior Echocardiogram.   Lab work is reviewed  He feels well, back to baseline and wants to go home Prior to admission, the patient denies chest pain, shortness of breath, dizziness, palpitations, or syncope.      Past Medical History:  Diagnosis Date   Gout    Hypercholesteremia    Hypertension      Surgical History:  Past Surgical History:  Procedure Laterality Date   KNEE SURGERY     PROSTATECTOMY       Medications Prior to Admission  Medication Sig Dispense Refill Last Dose   apixaban (ELIQUIS) 5 MG TABS tablet Take 1 tablet (5 mg total) by mouth 2 (two) times daily. 60 tablet 3 09/16/2022 at 2000   aspirin EC 81 MG tablet Take 1 tablet (81 mg total) by mouth daily. Swallow whole. 30 tablet 12 09/16/2022   atorvastatin (LIPITOR) 40 MG tablet Take 40 mg by mouth daily.   09/16/2022   empagliflozin (JARDIANCE) 10 MG TABS tablet Take 1 tablet (10 mg total) by mouth daily. 30 tablet 3 09/16/2022   febuxostat (ULORIC) 40 MG tablet Take 40 mg by mouth daily.   09/16/2022   MAGNESIUM PO Take 1 tablet by mouth daily.   09/16/2022   metoprolol succinate (TOPROL-XL) 25 MG 24 hr tablet Take 1 tablet (25 mg total) by mouth  daily. 30 tablet 3 09/16/2022 at 0930   potassium chloride (K-DUR,KLOR-CON) 10 MEQ tablet Take 20 mEq by mouth daily.   09/16/2022 at am   sacubitril-valsartan (ENTRESTO) 24-26 MG Take 1 tablet by mouth 2 (two) times daily. 60 tablet 3 09/16/2022 at 2000   spironolactone (ALDACTONE) 25 MG tablet Take 0.5 tablets (12.5 mg total) by mouth daily. 30 tablet 3 09/16/2022   VITAMIN D PO Take 1 tablet by mouth daily.   09/16/2022    Inpatient Medications:   apixaban  5 mg Oral BID   aspirin EC  81 mg Oral Daily   atorvastatin  40 mg Oral Daily   empagliflozin  10 mg Oral Daily   febuxostat  40 mg Oral Daily   metoprolol succinate  25 mg Oral Daily   potassium chloride  20 mEq Oral Daily   sacubitril-valsartan  1 tablet Oral BID   sodium chloride flush  3 mL Intravenous Q12H    Allergies:  Allergies  Allergen Reactions   Allopurinol Itching and Nausea And Vomiting    Social History   Socioeconomic History   Marital status: Married    Spouse name: Not on file  Number of children: Not on file   Years of education: Not on file   Highest education level: Not on file  Occupational History   Not on file  Tobacco Use   Smoking status: Every Day    Types: E-cigarettes   Smokeless tobacco: Never  Vaping Use   Vaping Use: Every day  Substance and Sexual Activity   Alcohol use: Yes    Alcohol/week: 4.0 standard drinks of alcohol    Types: 4 Shots of liquor per week   Drug use: No   Sexual activity: Not on file  Other Topics Concern   Not on file  Social History Narrative   Not on file   Social Determinants of Health   Financial Resource Strain: Not on file  Food Insecurity: No Food Insecurity (09/17/2022)   Hunger Vital Sign    Worried About Running Out of Food in the Last Year: Never true    Ran Out of Food in the Last Year: Never true  Transportation Needs: No Transportation Needs (09/17/2022)   PRAPARE - Hydrologist (Medical): No    Lack of Transportation  (Non-Medical): No  Physical Activity: Not on file  Stress: Not on file  Social Connections: Not on file  Intimate Partner Violence: Not At Risk (09/17/2022)   Humiliation, Afraid, Rape, and Kick questionnaire    Fear of Current or Ex-Partner: No    Emotionally Abused: No    Physically Abused: No    Sexually Abused: No     History reviewed. He is adopted, family hx is unknown   Review of Systems: All other systems reviewed and are otherwise negative except as noted above.  Physical Exam: Vitals:   09/17/22 2150 09/17/22 2353 09/18/22 0353 09/18/22 0805  BP: 109/60 107/68 103/66 102/76  Pulse:  71 73 75  Resp: 16 17 17 18   Temp: 99.5 F (37.5 C) 99.5 F (37.5 C) 99 F (37.2 C) 98.4 F (36.9 C)  TempSrc:  Oral Oral Oral  SpO2: 98% 97% 97% 99%  Weight:      Height:        GEN- The patient is well appearing, alert and oriented x 3 today.   Head- normocephalic, atraumatic Eyes-  Sclera clear, conjunctiva pink Ears- hearing intact Oropharynx- clear Neck- supple Lungs- CTA b/l normal work of breathing Heart- RRR, no murmurs, rubs or gallops  GI- soft, NT, ND Extremities- no clubbing, cyanosis, or edema MS- no significant deformity or atrophy Skin- no rash or lesion Psych- euthymic mood, full affect   Labs:   Lab Results  Component Value Date   WBC 10.2 09/18/2022   HGB 11.7 (L) 09/18/2022   HCT 35.8 (L) 09/18/2022   MCV 91.6 09/18/2022   PLT 355 09/18/2022    Recent Labs  Lab 09/16/22 2131 09/18/22 0253  NA 134* 133*  K 3.4* 3.6  CL 102 104  CO2 22 22  BUN 7 8  CREATININE 1.13 1.15  CALCIUM 9.1 8.5*  PROT 6.8  --   BILITOT 0.7  --   ALKPHOS 132*  --   ALT 26  --   AST 43*  --   GLUCOSE 91 105*   No results found for: "CKTOTAL", "CKMB", "CKMBINDEX", "TROPONINI" Lab Results  Component Value Date   CHOL 196 09/17/2022   Lab Results  Component Value Date   HDL 56 09/17/2022   Lab Results  Component Value Date   LDLCALC 115 (H) 09/17/2022  Lab Results  Component Value Date   TRIG 124 09/17/2022   Lab Results  Component Value Date   CHOLHDL 3.5 09/17/2022   No results found for: "LDLDIRECT"  Lab Results  Component Value Date   DDIMER 0.71 (H) 09/13/2022     Radiology/Studies:  MR BRAIN WO CONTRAST Result Date: 09/17/2022 CLINICAL DATA:  Initial evaluation for neuro deficit, stroke suspected. EXAM: MRI HEAD WITHOUT CONTRAST TECHNIQUE: Multiplanar, multiecho pulse sequences of the brain and surrounding structures were obtained without intravenous contrast. COMPARISON:  Comparison made with prior CTs from 09/16/2022. FINDINGS: Brain: Mild age-related cerebral atrophy. Patchy T2/FLAIR hyperintensity involving the periventricular and deep white matter, consistent with chronic small vessel ischemic disease, mild in nature. Small remote lacunar infarct at the left basal ganglia. Approximate 1.5 cm focus of restricted diffusion involving the subcortical posterior left frontal low, consistent with a small acute ischemic infarct (series 5, image 97). Involvement of the precentral gyrus. Additional punctate subcentimeter cortical infarct noted at the contralateral right parietal lobe (series 5, image 91). Few additional probable punctate ischemic infarcts noted involving the cortical gray matter of the left frontoparietal region (series 5, image 88) as well as the right occipital lobe (series 5, images 84, 81). No associated hemorrhage or mass effect. These are likely embolic in nature. No other evidence for acute or subacute ischemia. Gray-white matter differentiation otherwise maintained. No other areas of chronic cortical infarction. No acute or chronic intracranial blood products. No mass lesion, midline shift or mass effect. No hydrocephalus or extra-axial fluid collection. Pituitary gland and suprasellar region within normal limits. Vascular: Major intracranial vascular flow voids are maintained. Skull and upper cervical spine:  Craniocervical junction within normal limits. Bone marrow signal intensity normal. No scalp soft tissue abnormality. Sinuses/Orbits: Prior ocular lens replacement on the left. Few suspected polyps noted within the left nasal cavity, largest of which measures 1.8 cm (series 10, image 6). Mucosal thickening with a few small retention cysts versus polyps noted about the maxillary sinuses as well. No mastoid effusion. Other: None. IMPRESSION: 1. 1.5 cm acute ischemic nonhemorrhagic subcortical posterior left frontal infarct, with a few scattered additional punctate nonhemorrhagic infarcts involving the bilateral parieto-occipital regions as above. These are likely embolic in nature. 2. Underlying mild age-related cerebral atrophy with chronic small vessel ischemic disease, with a small remote left basal ganglia lacunar infarct. 3. 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 Correlation with physical exam suggested. Electronically Signed   By: Benjamin  McClintock M.D.   On: 09/17/2022 03:30   CT ANGIO HEAD NECK W WO CM Result Date: 09/17/2022 CLINICAL DATA:  Initial evaluation for stroke. EXAM: CT ANGIOGRAPHY HEAD AND NECK TECHNIQUE: Multidetector CT imaging of the head and neck was performed using the standard protocol during bolus administration of intravenous contrast. Multiplanar CT image reconstructions and MIPs were obtained to evaluate the vascular anatomy. Carotid stenosis measurements (when applicable) are obtained utilizing NASCET criteria, using the distal internal carotid diameter as the denominator. RADIATION DOSE REDUCTION: This exam was performed according to the departmental dose-optimization program which includes automated exposure control, adjustment of the mA and/or kV according to patient size and/or use of iterative reconstruction technique. CONTRAST:  [id number] OMNIPAQUE IOHEXOL 350 MG/ML SOLN COMPARISON:  Prior CT from earlier the same day. FINDINGS: CTA NECK FINDINGS  Aortic arch: Visualized aortic arch normal caliber standard branch pattern. Mild aortic atherosclerosis. No stenosis about the origin the great vessels. Right carotid system: Right common and internal carotid arteries  are patent without dissection. Mild eccentric soft plaque within the mid right CCA without significant stenosis. Mild calcified plaque about the right carotid bulb/proximal right ICA without hemodynamically significant greater than 50% stenosis. Left carotid system: Left common and internal carotid arteries are patent without dissection. Mild calcified plaque about the left carotid bulb/proximal left ICA without hemodynamically significant greater than 50% stenosis. Vertebral arteries: Both vertebral arteries arise from the subclavian arteries. No proximal subclavian artery stenosis. Both vertebral arteries widely patent without stenosis, dissection or occlusion. Skeleton: No discrete or worrisome osseous lesions. Patient is edentulous. Other neck: No other acute soft tissue abnormality within the neck. Upper chest: Visualized upper chest demonstrates no acute finding. Review of the MIP images confirms the above findings CTA HEAD FINDINGS Anterior circulation: Atheromatous change within the carotid siphons with associated moderate multifocal narrowing. A1 segments, anterior communicating complex common anterior cerebral arteries widely patent. No M1 stenosis or occlusion. No proximal MCA branch occlusion or high-grade stenosis. Distal MCA branches perfused and symmetric. Posterior circulation: Both V4 segments patent without significant stenosis. Both PICA patent. Basilar patent without stenosis. Superior cerebellar and posterior cerebral arteries patent bilaterally. Venous sinuses: Grossly patent allowing for timing the contrast bolus. Anatomic variants: None significant.  No aneurysm. Review of the MIP images confirms the above findings IMPRESSION: 1. Negative CTA for large vessel occlusion or other  emergent finding. 2. Atheromatous change about the carotid siphons with associated moderate multifocal narrowing. 3. Mild atheromatous change about the carotid bifurcations/proximal ICAs without hemodynamically significant greater than 50% stenosis. Aortic Atherosclerosis (ICD10-I70.0). Electronically Signed   By: Rise Mu M.D.   On: 09/17/2022 00:15   CT HEAD CODE STROKE WO CONTRAST Result Date: 09/16/2022 CLINICAL DATA:  Code stroke. Neuro deficit. Acute. Stroke suspected. Right hand numbness. EXAM: CT HEAD WITHOUT CONTRAST TECHNIQUE: Contiguous axial images were obtained from the base of the skull through the vertex without intravenous contrast. RADIATION DOSE REDUCTION: This exam was performed according to the departmental dose-optimization program which includes automated exposure control, adjustment of the mA and/or kV according to patient size and/or use of iterative reconstruction technique. COMPARISON:  None Available. FINDINGS: Brain: Normal appearance without evidence of old or acute infarction, mass lesion, hemorrhage, hydrocephalus or extra-axial collection. Vascular: There is atherosclerotic calcification of the major vessels at the base of the brain. Skull: Normal Sinuses/Orbits: Mild seasonal mucosal thickening.  Orbits negative. Other: None ASPECTS ([place]ber[place] Stroke Program Early CT Score) - Ganglionic level infarction (caudate, lentiform nuclei, internal capsule, insula, M1-M3 cortex): 7 - Supraganglionic infarction (M4-M6 cortex): 3 Total score (0-10 with 10 being normal): 10 IMPRESSION: 1. Normal head CT. 2. Aspects is 10. These results were called by telephone at the time of interpretation on 09/16/2022 at 9:52 pm to provider Ernie Avena , who verbally acknowledged these results. Electronically Signed   By: Paulina Fusi M.D.   On: 09/16/2022 21:54   CT Angio Chest Aorta W and/or Wo Contrast Result Date: 09/13/2022 CLINICAL DATA:  Acute aortic syndrome suspected. Splenic infarct on  abdominal CT earlier same day EXAM: CT ANGIOGRAPHY CHEST WITH CONTRAST TECHNIQUE: Multidetector CT imaging of the chest was performed using the standard protocol during bolus administration of intravenous contrast. Multiplanar CT image reconstructions and MIPs were obtained to evaluate the vascular anatomy. RADIATION DOSE REDUCTION: This exam was performed according to the departmental dose-optimization program which includes automated exposure control, adjustment of the mA and/or kV according to patient size and/or use of iterative reconstruction technique. CONTRAST:  OMNIPAQUE IOHEXOL  350 MG/ML SOLN COMPARISON:  CT abdomen/pelvis from earlier same day FINDINGS: Cardiovascular: Ascending thoracic aorta measures 3.3 cm diameter. Descending thoracic aorta is 2.6 cm diameter. Mild atherosclerotic calcification is noted in the wall of the thoracic aorta. No evidence for dissection flap within the thoracic aorta. Aortic arch vessel branch anatomy is widely patent. There is some minimal calcified mural thrombus along the medial wall of the lower thoracic aorta consistent with chronic process (axial 108/4). Enlargement of the pulmonary outflow tract/main pulmonary arteries suggests pulmonary arterial hypertension. No evidence for central pulmonary embolus in the main pulmonary arteries, lobar pulmonary arteries, or segmental pulmonary arteries. Imaging on the study was carried down into the mid abdomen and the celiac axis and SMA are widely patent. Splenic artery appears widely patent without appreciable atherosclerotic disease, filling defect or aneurysm. Mediastinum/Nodes: No mediastinal lymphadenopathy. There is no hilar lymphadenopathy. The esophagus has normal imaging features. There is no axillary lymphadenopathy. Lungs/Pleura: No focal airspace consolidation. No suspicious pulmonary nodule or mass. No pulmonary edema or pleural effusion. Upper Abdomen: Deferred to dedicated abdomen and pelvis CT exam  performed immediately prior to this study. Musculoskeletal: No worrisome lytic or sclerotic osseous abnormality. Review of the MIP images confirms the above findings. IMPRESSION: 1. No evidence for thoracic aortic aneurysm or dissection. There is some minimal calcified mural thrombus along the medial wall of the lower thoracic aorta consistent with chronic process. 2. Enlargement of the pulmonary outflow tract/main pulmonary arteries suggests pulmonary arterial hypertension. 3. No evidence for central pulmonary embolus. Electronically Signed   By: Kennith Center M.D.   On: 09/13/2022 05:18   CT Abdomen Pelvis W Contrast Result Date: 09/13/2022 CLINICAL DATA:  Left lower quadrant abdominal pain. EXAM: CT ABDOMEN AND PELVIS WITH CONTRAST TECHNIQUE: Multidetector CT imaging of the abdomen and pelvis was performed using the standard protocol following bolus administration of intravenous contrast. RADIATION DOSE REDUCTION: This exam was performed according to the departmental dose-optimization program which includes automated exposure control, adjustment of the mA and/or kV according to patient size and/or use of iterative reconstruction technique. CONTRAST:  OMNIPAQUE IOHEXOL 300 MG/ML  SOLN COMPARISON:  06/05/2009. FINDINGS: Lower chest: No acute abnormality. Hepatobiliary: No focal liver abnormality is seen. Fatty infiltration of the liver is noted. No gallstones, gallbladder wall thickening, or biliary dilatation. Pancreas: Unremarkable. No pancreatic ductal dilatation or surrounding inflammatory changes. Spleen: Multifocal hypodense regions are present in the spleen, suspicious for infarcts. No subcapsular hematoma is seen. Adrenals/Urinary Tract: The adrenal glands are within normal limits. Kidneys enhance symmetrically. No renal calculus or hydronephrosis. Bladder is unremarkable. Stomach/Bowel: Stomach is within normal limits. Appendix appears normal. No evidence of bowel wall thickening, distention, or  inflammatory changes. No free air or pneumatosis. Vascular/Lymphatic: Aortic atherosclerosis. No enlarged abdominal or pelvic lymph nodes. Reproductive: The prostate gland is not seen. Other: No abdominopelvic ascites. Musculoskeletal: Trabecular thickening and sclerosis are noted in the iliac bone on the left, suggesting Paget's disease. Mild degenerative changes in the thoracolumbar spine. No acute osseous abnormality IMPRESSION: 1. Regional hypodensities in the spleen, suggesting infarcts and indeterminate in age. 2. Hepatic steatosis. 3. Trabecular thickening and sclerosis involving the iliac bone on the left, possible Paget's disease versus osteoblastic metastasis. Electronically Signed   By: Thornell Sartorius M.D.   On: 09/13/2022 02:30    12-lead ECG SR All prior EKG's in EPIC reviewed with no documented atrial fibrillation  Telemetry SR  Assessment and Plan:  1. Cryptogenic stroke Abnormal Echos (reviewed by colleague, with marked  LVH (apical)) Abnormal EKG Patient will need further cardiology follow up In review of his record, appears from his last admission plans to get monitoring via the [place] and cardiology follow up His TEE/echo this admission with improved LVEF, no thrombus.  He is already on Eliquis, would continue as recommended by previous providers given his findings last admission Recommend further cardiac evaluation with cardiac MRI outpatient and monitoring to better assess cardiac needs  No loop at this juncture. Avoid QT prolonging medications  Dr. Quentin Ore has seen the patient Ok to discharge from [place], PA-C 09/18/2022

## 2022-09-19 LAB — PROTEIN ELECTROPHORESIS, SERUM
A/G Ratio: 1.1 (ref 0.7–1.7)
Albumin ELP: 2.6 g/dL — ABNORMAL LOW (ref 2.9–4.4)
Alpha-1-Globulin: 0.2 g/dL (ref 0.0–0.4)
Alpha-2-Globulin: 0.6 g/dL (ref 0.4–1.0)
Beta Globulin: 0.6 g/dL — ABNORMAL LOW (ref 0.7–1.3)
Gamma Globulin: 1 g/dL (ref 0.4–1.8)
Globulin, Total: 2.4 g/dL (ref 2.2–3.9)
Total Protein ELP: 5 g/dL — ABNORMAL LOW (ref 6.0–8.5)

## 2022-09-22 ENCOUNTER — Encounter (HOSPITAL_COMMUNITY): Payer: Self-pay | Admitting: Internal Medicine

## 2022-09-26 ENCOUNTER — Telehealth: Payer: Self-pay

## 2022-09-26 NOTE — Patient Outreach (Signed)
Received a red flag Emmi stroke notification for Mr. Forker. I have assigned Enzo Montgomery, RN to call for follow up and determine if there are any Case Management needs.    [facility], [place], [place] [facility] [phone]

## 2022-09-26 NOTE — Patient Outreach (Signed)
  Emmi Stroke Care Coordination Follow Up  09/26/2022 Name:  Albert King MRN:  [id number] DOB:  [birth year]  Subjective: Albert King is a 62 y.o. year old male who is a primary care patient of Pcp, No   An Emmi alert was received indicating patient responded to questions: Smoked or been around smoke?.   I reached out by phone to follow up on the alert.  Care Coordination Interventions:  No, not indicated   Follow up plan:  RN CM will make outreach attempt to patient again.    Encounter Outcome:  No Answer

## 2022-09-26 NOTE — Patient Outreach (Signed)
  Emmi Stroke Care Coordination Follow Up  09/26/2022 Name:  Albert King MRN:  [id number] DOB:  May 01, 1961  Subjective: Albert King is a 62 y.o. year old male who is a primary care patient of Pcp, No   An Emmi alert was received indicating patient responded to questions: Smoked or been around smoke?Marland Kitchen   Incoming call from patient returning RN CM call. He voices he is doing well and having no issues. Reviewed and addressed red alert. Patient states he is "vaping 3% nicotine". He is not interested in smoking cessation at this time. Reviewed with patient he risk of smoking and increased risk of another stroke. Patient confirms he has all his meds-no issues with them. He is followed by [facility] for all his providers-states he has PCP appt and he is going to follow up with neurology at [facility] as well. No issues with transportation. No further RN CM needs or concerns at this time.   Care Coordination Interventions:  Yes, provided   Follow up plan: Advised patient that they would continue to get automated EMMI-Stroke post discharge calls to assess how they are doing following recent hospitalization and will receive a call from a nurse if any of their responses were abnormal. Patient voiced understanding and was appreciative of f/u call.   Encounter Outcome:  Pt. Visit Completed   Enzo Montgomery, RN,BSN,CCM [place] [facility] Telephonic Care Management Coordinator Direct Phone: [phone] Toll Free: [phone] Fax: [phone]

## 2022-10-03 ENCOUNTER — Telehealth: Payer: Self-pay

## 2022-10-03 NOTE — Patient Outreach (Signed)
Received a red flag Emmi stroke notification for Mr. Helming. I have assigned Enzo Montgomery, RN to call for follow up and determine if there are any Case Management needs.    [facility], [place], [place] [facility] [phone]

## 2022-10-03 NOTE — Patient Outreach (Signed)
  Emmi Stroke Care Coordination Follow Up  10/03/2022 Name:  Albert King MRN:  [id number] DOB:  [birth year]  Subjective: Albert King is a 62 y.o. year old male who is a primary care patient of Pcp, No   An Emmi alert was received indicating patient responded to questions: Went to follow-up appointment?.   I reached out by phone to follow up on the alert and spoke to Patient.He voices he is doing good. Denies any acute issues or concerns at present. Reviewed and addressed red alert. Patient voices that he spoke with someone at the [place] today and they are arranging his follow up appts with needed providers. He denies any further RN CM needs or concerns at this time.   Care Coordination Interventions:  Yes, provided   Follow up plan: No further intervention required. Patient has completed post discharge automated calls.   Encounter Outcome:  Pt. Visit Completed

## 2024-03-15 ENCOUNTER — Telehealth: Payer: Self-pay | Admitting: Gastroenterology

## 2024-03-15 NOTE — Telephone Encounter (Signed)
 Good morning Dr. San, I received a call from this patient requesting to schedule an appointment for a referral we had received from the [facility] for other fecal abnormalities  . Patient stated that [facility] this was a month ago. Would you please advise on how to schedule this patient.   Thank you.

## 2024-04-14 NOTE — Telephone Encounter (Signed)
 Good afternoon Dr. San,   Patient's previous records are now available in Epic media for you to review and advise further.   Thank you.

## 2024-09-13 ENCOUNTER — Ambulatory Visit: Admitting: Physical Therapy

## 2024-11-01 ENCOUNTER — Ambulatory Visit: Admitting: Physical Therapy
